# Patient Record
Sex: Male | Born: 1990 | Race: Black or African American | Hispanic: No | Marital: Single | Smoking: Current every day smoker
Health system: Southern US, Community
[De-identification: ages and names within clinical notes are randomized; demographics above are authoritative.]

## PROBLEM LIST (undated history)

## (undated) DIAGNOSIS — H409 Unspecified glaucoma: Secondary | ICD-10-CM

## (undated) DIAGNOSIS — F319 Bipolar disorder, unspecified: Secondary | ICD-10-CM

## (undated) DIAGNOSIS — B009 Herpesviral infection, unspecified: Secondary | ICD-10-CM

## (undated) HISTORY — PX: EYE SURGERY: SHX253

---
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
## 2012-10-04 ENCOUNTER — Emergency Department: Payer: Self-pay | Admitting: Emergency Medicine

## 2018-11-11 ENCOUNTER — Ambulatory Visit
Admission: EM | Admit: 2018-11-11 | Discharge: 2018-11-11 | Disposition: A | Discharge status: Home / Self Care | Payer: Self-pay | Attending: Family Medicine | Admitting: Family Medicine

## 2018-11-11 ENCOUNTER — Encounter: Payer: Self-pay | Admitting: Emergency Medicine

## 2018-11-11 DIAGNOSIS — Z8669 Personal history of other diseases of the nervous system and sense organs: Secondary | ICD-10-CM

## 2018-11-11 DIAGNOSIS — H539 Unspecified visual disturbance: Secondary | ICD-10-CM

## 2018-11-11 DIAGNOSIS — H5711 Ocular pain, right eye: Secondary | ICD-10-CM

## 2018-11-11 HISTORY — DX: Unspecified glaucoma: H40.9

## 2018-11-11 NOTE — ED Provider Notes (Signed)
[facility]    CSN: [id number] Arrival date & time: 11/11/18  0809     History   Chief Complaint Chief Complaint  Patient presents with  . Eye Pain    HPI Gary Jacobson is a 28 y.o. male.   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 Denies any injuries. States he's had surgery on that eye in the past when he was a young child.      Past Medical History:  Diagnosis Date  . Glaucoma     There are no active problems to display for this patient.   Past Surgical History:  Procedure Laterality Date  . EYE SURGERY     pt states when he was infant       Home Medications    Prior to Admission medications   Medication Sig Start Date End Date Taking? Authorizing Provider  aspirin-sod bicarb-citric acid (ALKA-SELTZER) 325 MG TBEF Take 325 mg by mouth every 6 (six) hours as needed. cold     [provider]  guaiFENesin (MUCINEX) 600 MG 12 hr tablet Take 600 mg by mouth 2 (two) times daily as needed. congestion     [provider]    Family History Family History  Problem Relation Age of Onset  . Hypertension Mother     Social History Social History   Tobacco Use  . Smoking status: Current Every Day Smoker    Packs/day: 1.00    Types: Cigarettes  . Smokeless tobacco: Never Used  Substance Use Topics  . Alcohol use: Yes  . Drug use: Not Currently    Types: Marijuana     Allergies   Patient has no known allergies.   Review of Systems Review of Systems   Physical Exam Triage Vital Signs ED Triage Vitals  Enc Vitals Group     BP 11/11/18 0826 (!) 129/92     Pulse Rate 11/11/18 0826 (!) 59     Resp 11/11/18 0826 16     Temp 11/11/18 0833 98 F (36.7 C)     Temp src --      SpO2 11/11/18 0826 99 %     Weight --      Height --      Head Circumference --      Peak Flow --      Pain Score --    Pain Loc --      Pain Edu? --      Excl. in [place]? --    No data found.  Updated Vital Signs BP (!) 129/92 (BP Location: Left Arm)   Pulse (!) 59   Temp 98 F (36.7 C)   Resp 16   SpO2 99%   Visual Acuity Right Eye Distance: 20/200 Left Eye Distance: 20/40 Bilateral Distance:    Right Eye Near:   Left Eye Near:    Bilateral Near:     Physical Exam Vitals signs reviewed.  Constitutional:      General: He is not in acute distress.    Appearance: He is not toxic-appearing.  Eyes:     General: Lids are normal.     Extraocular Movements: Extraocular movements intact.     Conjunctiva/sclera:     Right eye: Right conjunctiva is not injected.     Left eye: Left conjunctiva is not injected.     Pupils: Pupils are unequal.  Comments: Right eye pupil dilated  Neurological:     Mental Status: He is alert.      UC Treatments / Results  Labs (all labs ordered are listed, but only abnormal results are displayed) Labs Reviewed - No data to display  EKG   Radiology No results found.  Procedures Procedures (including critical care time)  Medications Ordered in UC Medications - No data to display  Initial Impression / Assessment and Plan / UC Course  I have reviewed the triage vital signs and the nursing notes.  Pertinent labs & imaging results that were available during my care of the patient were reviewed by me and considered in my medical decision making (see chart for details).      Final Clinical Impressions(s) / UC Diagnoses   Final diagnoses:  Acute right eye pain  Vision disturbance  H/O: glaucoma     Discharge Instructions     Follow up at [facility] in [place] this morning with Dr. Lara MulchHarrow    ED Prescriptions    None      1. diagnosis reviewed with patient; discussed case with Dr. Lara MulchHarrow (on call for [facility]) who states patient can be seen this morning at the [facility] [place] office. Instructions given  to patient who will proceed there at this time.    Controlled Substance Prescriptions  Controlled Substance Registry consulted? Not Applicable   Payton Mccallumonty, Jasemine Nawaz, MD 11/11/18 1143

## 2018-11-11 NOTE — ED Triage Notes (Signed)
Patient states he has had multiple surgeries on his right eye and has been having increasing sensitivity to light and pain in the right eye

## 2018-11-11 NOTE — Discharge Instructions (Addendum)
Follow up at [facility] in [place] this morning with Dr. Neville Route

## 2019-02-02 ENCOUNTER — Encounter: Payer: Self-pay | Admitting: Emergency Medicine

## 2019-02-02 ENCOUNTER — Other Ambulatory Visit: Payer: Self-pay

## 2019-02-02 ENCOUNTER — Ambulatory Visit
Admission: EM | Admit: 2019-02-02 | Discharge: 2019-02-02 | Disposition: A | Discharge status: Home / Self Care | Payer: Self-pay | Attending: Family | Admitting: Family

## 2019-02-02 DIAGNOSIS — R030 Elevated blood-pressure reading, without diagnosis of hypertension: Secondary | ICD-10-CM

## 2019-02-02 DIAGNOSIS — R0789 Other chest pain: Secondary | ICD-10-CM

## 2019-02-02 DIAGNOSIS — S29011A Strain of muscle and tendon of front wall of thorax, initial encounter: Secondary | ICD-10-CM

## 2019-02-02 HISTORY — DX: Bipolar disorder, unspecified: F31.9

## 2019-02-02 MED ORDER — NAPROXEN 500 MG PO TABS: 500.0000 mg | ORAL_TABLET | Freq: Two times a day (BID) | ORAL | 0 refills | Status: DC | PRN

## 2019-02-02 MED ORDER — CYCLOBENZAPRINE HCL 10 MG PO TABS: ORAL_TABLET | ORAL | 0 refills | Status: DC

## 2019-02-02 NOTE — ED Triage Notes (Signed)
Patient here today stated that 3d ago at work opened door felt a pain not work related per patient. But before then was playing with girlfriend and picked her up and felt something.  TDH:RCBU seltzer pain relief, Hydrocodone and antacid

## 2019-02-02 NOTE — Discharge Instructions (Addendum)
Recommend start Naproxen 500mg  twice a day as needed for pain. May use Flexeril muscle relaxer- take 1 whole tablet at night- may take 1/2 to 1 whole tablet every 8 to 12 hours as needed during the day- may cause drowsiness. May apply warm moist heat to area for comfort. Avoid lifting or straining muscles in chest. If any increase in pain, difficulty breathing, nausea or numbness occurs, go to the ER ASAP. Otherwise, Follow-up with your PCP in 4 to 5 days for recheck.

## 2019-02-03 NOTE — ED Provider Notes (Signed)
[facility]    CSN: [id number] Arrival date & time: 02/02/19  1019      History   Chief Complaint Chief Complaint  Patient presents with  . Chest Pain    HPI Gary Jacobson is a 28 y.o. male.   28 year old male presents with left sided mid-rib chest pain that has been present for the past 2 to 3 days. Originally was playing around with his girlfriend and picked her up off the ground about 4 to 5 days ago and felt a pull on the muscles of his chest. Minimal pain at the time. Then he used his left arm to pull at a door at work 3 days ago and felt immediate pain in his chest. He indicated that the pain is worse with certain movements and with taking a deep breath. He denies any vision changes, shortness of breath, sweating, nausea, vomiting or any numbness or radiation of pain down his arm. He took an Camera operator and antacid with minimal help and took an Oxycodone he had left over from previous injury which did help. No history of cardiac or pulmonary disease but does smoke tobacco daily. Mom has history of HTN and he has had elevated BP readings in the past. Otherwise, no other chronic health issues except bipolar disorder. Takes a multivitamin daily.   The history is provided by the patient.    Past Medical History:  Diagnosis Date  . Bipolar 1 disorder ([facility])   . Glaucoma     There are no active problems to display for this patient.   Past Surgical History:  Procedure Laterality Date  . EYE SURGERY     pt states when he was infant       Home Medications    Prior to Admission medications   Medication Sig Start Date End Date Taking? Authorizing Provider  cyclobenzaprine (FLEXERIL) 10 MG tablet Take 1/2 to 1 whole tablet by mouth every 8 hours as needed for muscle pain/spasms. 02/02/19   Katy Apo, NP  naproxen (NAPROSYN) 500 MG tablet Take 1 tablet (500 mg total) by mouth 2 (two) times daily as needed for moderate pain. 02/02/19   Katy Apo,  NP    Family History Family History  Problem Relation Age of Onset  . Hypertension Mother     Social History Social History   Tobacco Use  . Smoking status: Current Every Day Smoker    Packs/day: 1.00    Types: Cigarettes  . Smokeless tobacco: Never Used  Substance Use Topics  . Alcohol use: Yes  . Drug use: Not Currently    Types: Marijuana     Allergies   Patient has no known allergies.   Review of Systems Review of Systems  Constitutional: Negative for activity change, appetite change, chills, diaphoresis, fatigue and fever.  Eyes: Negative for photophobia and visual disturbance.  Respiratory: Positive for chest tightness (with deep breaths). Negative for cough, shortness of breath and wheezing.   Cardiovascular: Positive for chest pain. Negative for palpitations.  Gastrointestinal: Negative for abdominal pain, nausea and vomiting.  Musculoskeletal: Positive for myalgias. Negative for arthralgias, back pain and neck pain.  Skin: Negative for color change, rash and wound.  Allergic/Immunologic: Negative for environmental allergies, food allergies and immunocompromised state.  Neurological: Negative for dizziness, tremors, seizures, syncope, facial asymmetry, speech difficulty, weakness, light-headedness, numbness and headaches.  Hematological: Negative for adenopathy. Does not bruise/bleed easily.     Physical Exam Triage Vital Signs ED  Triage Vitals  Enc Vitals Group     BP 02/02/19 1044 (!) 143/100     Pulse Rate 02/02/19 1044 63     Resp 02/02/19 1044 18     Temp 02/02/19 1044 98.4 F (36.9 C)     Temp Source 02/02/19 1044 Oral     SpO2 02/02/19 1044 100 %     Weight 02/02/19 1037 175 lb (79.4 kg)     Height 02/02/19 1037 5\' 9"  (1.753 m)     Head Circumference --      Peak Flow --      Pain Score 02/02/19 1036 6     Pain Loc --      Pain Edu? --      Excl. in [place]? --    No data found.  Updated Vital Signs BP (!) 143/100   Pulse 63   Temp 98.4 F  (36.9 C) (Oral)   Resp 18   Ht 5\' 9"  (1.753 m)   Wt 175 lb (79.4 kg)   SpO2 100%   BMI 25.84 kg/m   Visual Acuity Right Eye Distance:   Left Eye Distance:   Bilateral Distance:    Right Eye Near:   Left Eye Near:    Bilateral Near:     Physical Exam Vitals signs and nursing note reviewed.  Constitutional:      General: He is awake. He is not in acute distress.    Appearance: He is well-developed, well-groomed and normal weight. He is not ill-appearing.     Comments: Patient sitting comfortably in exam bed in no acute distress.   HENT:     Head: Normocephalic and atraumatic.     Right Ear: Hearing and external ear normal.     Left Ear: Hearing and external ear normal.     Nose: Nose normal.     Mouth/Throat:     Lips: Pink.     Mouth: Mucous membranes are moist.     Pharynx: Oropharynx is clear.  Eyes:     Extraocular Movements: Extraocular movements intact.     Conjunctiva/sclera: Conjunctivae normal.     Pupils: Pupils are equal, round, and reactive to light.  Neck:     Musculoskeletal: Normal range of motion and neck supple.  Cardiovascular:     Rate and Rhythm: Normal rate and regular rhythm.     Pulses: Normal pulses.     Heart sounds: Normal heart sounds. No murmur.  Pulmonary:     Effort: Pulmonary effort is normal. No respiratory distress.     Breath sounds: Normal breath sounds and air entry. No decreased air movement. No decreased breath sounds, wheezing, rhonchi or rales.  Chest:     Chest wall: Tenderness present. No mass, deformity, swelling, crepitus or edema. There is no dullness to percussion.       Comments: Tender along rib cage under left breast. No distinct swelling. Has full range of motion of chest and arm but some pain and pulling of muscle with rotation of his left arm toward his back and lifting his arm above his head. No bruising seen.  Musculoskeletal: Normal range of motion.        General: Tenderness present.  Lymphadenopathy:      Upper Body:     Right upper body: No supraclavicular adenopathy.     Left upper body: No supraclavicular adenopathy.  Skin:    General: Skin is warm and dry.     Capillary Refill: Capillary refill takes less  than 2 seconds.     Findings: No bruising, erythema or rash.  Neurological:     General: No focal deficit present.     Mental Status: He is alert and oriented to person, place, and time.     Cranial Nerves: No cranial nerve deficit or facial asymmetry.     Sensory: Sensation is intact.     Motor: Motor function is intact.  Psychiatric:        Mood and Affect: Mood normal.        Behavior: Behavior normal. Behavior is cooperative.        Thought Content: Thought content normal.        Judgment: Judgment normal.      UC Treatments / Results  Labs (all labs ordered are listed, but only abnormal results are displayed) Labs Reviewed - No data to display  EKG   Radiology No results found.  Procedures ED EKG  Date/Time: 02/02/2019 10:54 AM Performed by: Sudie Grumbling, NP Authorized by: Sudie Grumbling, NP   ECG reviewed by ED Physician in the absence of a cardiologist: no   Previous ECG:    Previous ECG:  Unavailable Interpretation:    Interpretation: non-specific   Rate:    ECG rate:  50   ECG rate assessment: bradycardic   Rhythm:    Rhythm: sinus bradycardia   Ectopy:    Ectopy: none   T waves:    T waves: non-specific   Comments:     Reviewed EKG results with Colon Flattery, PA- did not appear to have significant conduction delay and believes the elevated T waves that trigger the result of pericarditis may be a result of the chest wall muscle strain and tenderness. Do not see findings suggestive of an emergent condition. Would recommend repeating EKG and/or following up with his PCP within a week.    (including critical care time)  Medications Ordered in UC Medications - No data to display  Initial Impression / Assessment and Plan / UC Course  I have  reviewed the triage vital signs and the nursing notes.  Pertinent labs & imaging results that were available during my care of the patient were reviewed by me and considered in my medical decision making (see chart for details).    Reviewed EKG with Colon Flattery, PA. Does have some bradycardia but patient does exercise often. Doubt cardiac origin or pericarditis- more likely chest wall muscle strain. Will continue to monitor. Recommend trial Naproxen 500mg  twice a day as directed for pain. May use Flexeril 10mg  1/2 to 1 tablet every 8 hours as needed for muscle pain/spasms. Apply warm compresses to area for comfort. Avoid lifting or further straining muscles in chest. Note written for work. Continue to monitor blood pressure. IF any increase in pain, difficulty breathing, dizziness, nausea or numbness occurs, go to the ER ASAP. Otherwise, follow up with your PCP in 4 to 5 days for blood pressure and symptom recheck.   Final Clinical Impressions(s) / UC Diagnoses   Final diagnoses:  Chest wall pain  Muscle strain of chest wall, initial encounter  Elevated blood-pressure reading without diagnosis of hypertension     Discharge Instructions     Recommend start Naproxen 500mg  twice a day as needed for pain. May use Flexeril muscle relaxer- take 1 whole tablet at night- may take 1/2 to 1 whole tablet every 8 to 12 hours as needed during the day- may cause drowsiness. May apply warm moist heat to area for  comfort. Avoid lifting or straining muscles in chest. If any increase in pain, difficulty breathing, nausea or numbness occurs, go to the ER ASAP. Otherwise, Follow-up with your PCP in 4 to 5 days for recheck.     ED Prescriptions    Medication Sig Dispense Auth. Provider   naproxen (NAPROSYN) 500 MG tablet Take 1 tablet (500 mg total) by mouth 2 (two) times daily as needed for moderate pain. 20 tablet Sudie Grumbling, NP   cyclobenzaprine (FLEXERIL) 10 MG tablet Take 1/2 to 1 whole tablet by mouth  every 8 hours as needed for muscle pain/spasms. 12 tablet Danuta Huseman, Ali Lowe, NP     PDMP not reviewed this encounter.   Sudie Grumbling, NP 02/03/19 1036

## 2019-05-07 ENCOUNTER — Other Ambulatory Visit: Payer: Self-pay

## 2019-05-07 ENCOUNTER — Ambulatory Visit
Admission: EM | Admit: 2019-05-07 | Discharge: 2019-05-07 | Disposition: A | Discharge status: Home / Self Care | Payer: Self-pay | Attending: Emergency Medicine | Admitting: Emergency Medicine

## 2019-05-07 DIAGNOSIS — R21 Rash and other nonspecific skin eruption: Secondary | ICD-10-CM

## 2019-05-07 MED ORDER — PREDNISONE 10 MG (21) PO TBPK: ORAL_TABLET | ORAL | 0 refills | Status: DC

## 2019-05-07 MED ORDER — PERMETHRIN 5 % EX CREA: TOPICAL_CREAM | CUTANEOUS | 0 refills | Status: DC

## 2019-05-07 NOTE — Discharge Instructions (Addendum)
Switch back to your old laundry detergent, finish the prednisone taper.  Try Claritin or Zyrtec.  If this does not work, then use the permethrin.  This is for scabies.  I think that this is less likely, I think this is more a contact dermatitis or from fleas.  Make sure you wash all of your sheets, linens and towels in very hot water.

## 2019-05-07 NOTE — ED Triage Notes (Signed)
Patient presents to Urgent Care with complaints of irritating rash in bilateral axilla since last week. Patient reports he may have used an expired deodorant, states there is one new place on his face as well.

## 2019-05-07 NOTE — ED Provider Notes (Signed)
HPI  SUBJECTIVE:  Gary Jacobson is a 29 y.o. male who presents with 2 weeks of a fine, occasionally itchy, rash described as "bumps" concentrated in his bilateral axillae, but also located on his torso, back.  No fevers, body aches, crusting.  No itching worse at night.  Denies pain, burning.  He is using a new Engineer, manufacturing.  No new lotions, soaps, foods.  He does not take any medications on a regular basis.  No contacts with similar rash.  No sensation of being bitten at night, blood on the bed clothes in the morning, known exposure to poison ivy or poison oak.  He states that he had a dog in the house is not sure if it had fleas or not.  He tried an unknown ointment for ringworm and an unknown antiitch cream without improvement in his symptoms.  Symptoms are worse with taking hot showers.  No alleviating factors.  He has a past medical history of HSV.  No history of diabetes, eczema.  PMD: None.    Past Medical History:  Diagnosis Date  . Bipolar 1 disorder ([facility])   . Glaucoma     Past Surgical History:  Procedure Laterality Date  . EYE SURGERY     pt states when he was infant    Family History  Problem Relation Age of Onset  . Hypertension Mother   . Asthma Mother   . Healthy Father     Social History   Tobacco Use  . Smoking status: Current Every Day Smoker    Packs/day: 0.30    Types: Cigarettes  . Smokeless tobacco: Never Used  Substance Use Topics  . Alcohol use: Yes    Comment: rare  . Drug use: Not Currently    Types: Marijuana    Comment: daily    No current facility-administered medications for this encounter.  Current Outpatient Medications:  .  permethrin (ELIMITE) 5 % cream, Apply from chin down, leave on for 8-14 hours, rinse. Repeat in 1 week, Disp: 60 g, Rfl: 0 .  predniSONE (STERAPRED UNI-PAK 21 TAB) 10 MG (21) TBPK tablet, Dispense one 6 day pack. Take as directed with food., Disp: 21 tablet, Rfl: 0  No Known Allergies   ROS  As  noted in HPI.   Physical Exam  BP 132/82 (BP Location: Left Arm)   Pulse 72   Temp 98.6 F (37 C) (Oral)   Resp 16   SpO2 99%   Constitutional: Well developed, well nourished, no acute distress Eyes:  EOMI, conjunctiva normal bilaterally HENT: Normocephalic, atraumatic,mucus membranes moist Respiratory: Normal inspiratory effort Cardiovascular: Normal rate GI: nondistended skin:  nontender papules without crusting or blisters in bilateral axilla, torso.  Seem to be concentrated in the axilla.  No burrows between fingers.        Musculoskeletal: no deformities Neurologic: Alert & oriented x 3, no focal neuro deficits Psychiatric: Speech and behavior appropriate   ED Course   Medications - No data to display  No orders of the defined types were placed in this encounter.   No results found for this or any previous visit (from the past 24 hour(s)). No results found.  ED Clinical Impression  1. Rash      ED Assessment/Plan  Patient with rash.  Could be fleas versus scabies versus contact dermatitis from the new bleach that he is using. will have him switch to his old laundry detergent, sent home with a prednisone 6-day taper, Claritin or Zyrtec.  If this does not work, permethrin.  Providing primary care list for ongoing care.   Meds ordered this encounter  Medications  . permethrin (ELIMITE) 5 % cream    Sig: Apply from chin down, leave on for 8-14 hours, rinse. Repeat in 1 week    Dispense:  60 g    Refill:  0  . predniSONE (STERAPRED UNI-PAK 21 TAB) 10 MG (21) TBPK tablet    Sig: Dispense one 6 day pack. Take as directed with food.    Dispense:  21 tablet    Refill:  0    *This clinic note was created using [employer]. Therefore, there may be occasional mistakes despite careful proofreading.   ?    Melynda Ripple, MD 05/08/19 [phone]

## 2019-05-19 ENCOUNTER — Ambulatory Visit
Admission: EM | Admit: 2019-05-19 | Discharge: 2019-05-19 | Disposition: A | Discharge status: Home / Self Care | Payer: Self-pay | Attending: Emergency Medicine | Admitting: Emergency Medicine

## 2019-05-19 ENCOUNTER — Other Ambulatory Visit: Payer: Self-pay

## 2019-05-19 ENCOUNTER — Encounter: Payer: Self-pay | Admitting: Emergency Medicine

## 2019-05-19 DIAGNOSIS — R21 Rash and other nonspecific skin eruption: Secondary | ICD-10-CM

## 2019-05-19 DIAGNOSIS — L42 Pityriasis rosea: Secondary | ICD-10-CM

## 2019-05-19 MED ORDER — HYDROXYZINE HCL 25 MG PO TABS: 25.0000 mg | ORAL_TABLET | Freq: Three times a day (TID) | ORAL | 0 refills | Status: AC | PRN

## 2019-05-19 MED ORDER — PREDNISONE 10 MG PO TABS: ORAL_TABLET | ORAL | 0 refills | Status: DC

## 2019-05-19 NOTE — ED Provider Notes (Signed)
[facility] ____________________________________________  Time seen: Approximately 9:50 AM  I have reviewed the triage vital signs and the nursing notes.   HISTORY  Chief Complaint Rash   HPI Gary Jacobson is a 29 y.o. male presenting for reevaluation of itchy rash that has been present for approximately 3 weeks.  Patient reports he was seen at the beginning of January for the same complaint and states that the medicine may have helped some, the cream did not help, but reports the rash has persisted.  States it changes locations.  States he did have a few larger areas on his back at initial onset.  Denies any pain.  Denies others around him or in the household with similar.  States rash is very itchy.  Has been under stress lately.  No rash to palms of hands or soles of feet.  States the rash is mostly in his torso and less in his lower extremities.  States initially he had concerned it was due to a dog or detergent, states he has changed to hypoallergenic as well as gotten rid of the dog and no change.  Denies other aggravating alleviating factors.  No recent fevers or sickness.  States feels well otherwise.   Past Medical History:  Diagnosis Date  . Bipolar 1 disorder (HCC)   . Glaucoma     There are no problems to display for this patient.   Past Surgical History:  Procedure Laterality Date  . EYE SURGERY     pt states when he was infant     No current facility-administered medications for this encounter.  Current Outpatient Medications:  .  hydrOXYzine (ATARAX/VISTARIL) 25 MG tablet, Take 1 tablet (25 mg total) by mouth 3 (three) times daily as needed for itching., Disp: 20 tablet, Rfl: 0 .  predniSONE (DELTASONE) 10 MG tablet, Start 60 mg po day one, then 50 mg po day two, taper by 10 mg daily until complete., Disp: 21 tablet, Rfl: 0  Allergies Patient has no known allergies.  Family History  Problem Relation Age of Onset  . Hypertension Mother   .  Asthma Mother   . Healthy Father     Social History Social History   Tobacco Use  . Smoking status: Current Every Day Smoker    Packs/day: 0.30    Types: Cigarettes  . Smokeless tobacco: Never Used  Substance Use Topics  . Alcohol use: Yes    Comment: rare  . Drug use: Not Currently    Types: Marijuana    Comment: daily    Review of Systems Constitutional: No fever ENT: No sore throat. Cardiovascular: Denies chest pain. Respiratory: Denies shortness of breath. Gastrointestinal: No abdominal pain.  No nausea, no vomiting.  No diarrhea. Musculoskeletal: Negative for back pain. Skin: positive for rash. Neurological: Negative for focal weakness or numbness.   ____________________________________________   PHYSICAL EXAM:  VITAL SIGNS: ED Triage Vitals  Enc Vitals Group     BP 05/19/19 0905 131/75     Pulse Rate 05/19/19 0905 65     Resp 05/19/19 0905 18     Temp 05/19/19 0905 98.2 F (36.8 C)     Temp Source 05/19/19 0905 Oral     SpO2 05/19/19 0905 99 %     Weight 05/19/19 0858 160 lb (72.6 kg)     Height 05/19/19 0858 5\' 9"  (1.753 m)     Head Circumference --      Peak Flow --      Pain  Score 05/19/19 0858 0     Pain Loc --      Pain Edu? --      Excl. in [place]? --     Constitutional: Alert and oriented. Well appearing and in no acute distress. Eyes: Conjunctivae are normal.  ENT      Head: Normocephalic and atraumatic. Cardiovascular: Normal heart rate. Good peripheral circulation. Respiratory: Normal respiratory effort without tachypnea nor retractions. Musculoskeletal:  Steady gait.  Neurologic:  Normal speech and language.Speech is normal. No gait instability.  Skin:  Skin is warm, dry.  Except: Scattered throughout torso and some to neck as well as upper extremities and proximal thighs flat, circular to ovular dry scaly rash, pruritic, nontender, no surrounding erythema. Psychiatric: Mood and affect are normal. Speech and behavior are normal. Patient  exhibits appropriate insight and judgment   ___________________________________________   LABS (all labs ordered are listed, but only abnormal results are displayed)  Labs Reviewed - No data to display ________________________________________   PROCEDURES Procedures    INITIAL IMPRESSION / ASSESSMENT AND PLAN / ED COURSE  Pertinent labs & imaging results that were available during my care of the patient were reviewed by me and considered in my medical decision making (see chart for details).  Well-appearing patient.  No acute distress.  Rash clinical appearance consistent with pityriasis rosea.  Patient did note some improvement with prednisone due to any concern of allergic reaction, will represcribe.  However discussed supportive care pityriasis.  As needed hydroxyzine.  Follow-up with dermatology for continued complaints. Discussed indication, risks and benefits of medications with patient.  Discussed follow up and return parameters including no resolution or any worsening concerns. Patient verbalized understanding and agreed to plan.   ____________________________________________   FINAL CLINICAL IMPRESSION(S) / ED DIAGNOSES  Final diagnoses:  Rash  Pityriasis rosea     ED Discharge Orders         Ordered    predniSONE (DELTASONE) 10 MG tablet     05/19/19 0930    hydrOXYzine (ATARAX/VISTARIL) 25 MG tablet  3 times daily PRN     05/19/19 0930           Note: This dictation was prepared with Dragon dictation along with smaller phrase technology. Any transcriptional errors that result from this process are unintentional.         Marylene Land, NP 05/19/19 1000

## 2019-05-19 NOTE — Discharge Instructions (Signed)
Take medication as prescribed. Rest. Drink plenty of fluids. Monitor.  ° °Follow up with your primary care physician this week as needed. Return to Urgent care for new or worsening concerns.  ° °

## 2019-05-19 NOTE — ED Triage Notes (Signed)
Pt c/o rash on bilateral forearms, neck, torso, and thighs. He states that he noticed it shortly after christmas. He was seen on 05/07/19 and given a cream and prednisone. He states that it is better in some places but has spread to different places on his body. He states that the rash is itchy, flat and dry.

## 2019-07-15 ENCOUNTER — Emergency Department
Admission: EM | Admit: 2019-07-15 | Discharge: 2019-07-16 | Disposition: A | Discharge status: Other Acute Inpatient Hospital | Payer: Medicaid Other | Attending: Emergency Medicine | Admitting: Emergency Medicine

## 2019-07-15 ENCOUNTER — Other Ambulatory Visit: Payer: Self-pay

## 2019-07-15 ENCOUNTER — Encounter: Payer: Self-pay | Admitting: Emergency Medicine

## 2019-07-15 DIAGNOSIS — Z79899 Other long term (current) drug therapy: Secondary | ICD-10-CM | POA: Insufficient documentation

## 2019-07-15 DIAGNOSIS — Z046 Encounter for general psychiatric examination, requested by authority: Secondary | ICD-10-CM | POA: Insufficient documentation

## 2019-07-15 DIAGNOSIS — Z20822 Contact with and (suspected) exposure to covid-19: Secondary | ICD-10-CM | POA: Insufficient documentation

## 2019-07-15 DIAGNOSIS — F419 Anxiety disorder, unspecified: Secondary | ICD-10-CM | POA: Insufficient documentation

## 2019-07-15 DIAGNOSIS — F17210 Nicotine dependence, cigarettes, uncomplicated: Secondary | ICD-10-CM | POA: Insufficient documentation

## 2019-07-15 DIAGNOSIS — F322 Major depressive disorder, single episode, severe without psychotic features: Secondary | ICD-10-CM | POA: Insufficient documentation

## 2019-07-15 DIAGNOSIS — R45851 Suicidal ideations: Secondary | ICD-10-CM | POA: Insufficient documentation

## 2019-07-15 LAB — RESPIRATORY PANEL BY RT PCR (FLU A&B, COVID)
Influenza A by PCR: NEGATIVE
Influenza B by PCR: NEGATIVE
SARS Coronavirus 2 by RT PCR: NEGATIVE

## 2019-07-15 LAB — COMPREHENSIVE METABOLIC PANEL
ALT: 37 U/L (ref 0–44)
AST: 28 U/L (ref 15–41)
Albumin: 5.2 g/dL — ABNORMAL HIGH (ref 3.5–5.0)
Alkaline Phosphatase: 62 U/L (ref 38–126)
Anion gap: 10 (ref 5–15)
BUN: 9 mg/dL (ref 6–20)
CO2: 26 mmol/L (ref 22–32)
Calcium: 10.3 mg/dL (ref 8.9–10.3)
Chloride: 101 mmol/L (ref 98–111)
Creatinine, Ser: 0.96 mg/dL (ref 0.61–1.24)
GFR calc Af Amer: >60 mL/min (ref >60)
GFR calc non Af Amer: >60 mL/min (ref >60)
Glucose, Bld: 111 mg/dL — ABNORMAL HIGH (ref 70–99)
Potassium: 4.2 mmol/L (ref 3.5–5.1)
Sodium: 137 mmol/L (ref 135–145)
Total Bilirubin: 0.5 mg/dL (ref 0.3–1.2)
Total Protein: 8.5 g/dL — ABNORMAL HIGH (ref 6.5–8.1)

## 2019-07-15 LAB — CBC
HCT: 46.5 % (ref 39.0–52.0)
Hemoglobin: 15.6 g/dL (ref 13.0–17.0)
MCH: 30.6 pg (ref 26.0–34.0)
MCHC: 33.5 g/dL (ref 30.0–36.0)
MCV: 91.2 fL (ref 80.0–100.0)
Platelets: 246 10*3/uL (ref 150–400)
RBC: 5.1 MIL/uL (ref 4.22–5.81)
RDW: 12.8 % (ref 11.5–15.5)
WBC: 7.6 10*3/uL (ref 4.0–10.5)
nRBC: 0 % (ref 0.0–0.2)

## 2019-07-15 LAB — ETHANOL: Alcohol, Ethyl (B): <10 mg/dL (ref <10)

## 2019-07-15 LAB — SALICYLATE LEVEL: Salicylate Lvl: <7 mg/dL — ABNORMAL LOW (ref 7.0–30.0)

## 2019-07-15 LAB — ACETAMINOPHEN LEVEL: Acetaminophen (Tylenol), Serum: <10 ug/mL — ABNORMAL LOW (ref 10–30)

## 2019-07-15 MED ORDER — DIAZEPAM 5 MG PO TABS
10.0000 mg | ORAL_TABLET | Freq: Once | ORAL | Status: AC
Start: 2019-07-15 — End: 2019-07-15
  Administered 2019-07-15: 16:00:00 10 mg via ORAL
  Filled 2019-07-15: qty 2

## 2019-07-15 MED ORDER — HYDROXYZINE HCL 25 MG PO TABS
50.0000 mg | ORAL_TABLET | Freq: Once | ORAL | Status: AC
  Administered 2019-07-15: 50 mg via ORAL
  Filled 2019-07-15: qty 2

## 2019-07-15 NOTE — ED Notes (Signed)
IVC  CONSULT  DONE  PENDING  PLACEMENT 

## 2019-07-15 NOTE — ED Provider Notes (Signed)
[facility] Emergency Department Provider Note  ____________________________________________  Time seen: Approximately 2:30 PM  I have reviewed the triage vital signs and the nursing notes.   HISTORY  Chief Complaint Psychiatric Evaluation    HPI Gary Jacobson is a 29 y.o. male with a past history of bipolar disorder who comes the ED complaining of suicidal thoughts for the past few days, denies a specific plan but states that he is disinterested in whether he lives or dies, says that he is ready to die.  Says that on his way to work today he just felt overwhelmed and "gave up."  Reports poor sleep, poor appetite, tearfulness.  Was recently started on Prozac by [place], been compliant with it for the past 2 weeks but does not feel like it is helping.  Also currently having some legal troubles.  Symptoms are constant, severe, no aggravating or alleviating factors.    Past Medical History:  Diagnosis Date  . Bipolar 1 disorder ([place])   . Glaucoma      There are no problems to display for this patient.    Past Surgical History:  Procedure Laterality Date  . EYE SURGERY     pt states when he was infant     Prior to Admission medications   Medication Sig Start Date End Date Taking? Authorizing Provider  doxepin (SINEQUAN) 50 MG capsule Take 50 mg by mouth at bedtime. 06/26/19  Yes [provider]  FLUoxetine (PROZAC) 10 MG capsule Take 10 mg by mouth daily. 06/26/19  Yes [provider]  hydrOXYzine (ATARAX/VISTARIL) 25 MG tablet Take 1 tablet (25 mg total) by mouth 3 (three) times daily as needed for itching. 05/19/19  Yes Marylene Land, NP  risperiDONE (RISPERDAL) 1 MG tablet Take 1 mg by mouth at bedtime. 06/26/19  Yes [provider]     Allergies Patient has no known allergies.   Family History  Problem Relation Age of Onset  . Hypertension Mother   . Asthma Mother   . Healthy Father     Social  History Social History   Tobacco Use  . Smoking status: Current Every Day Smoker    Packs/day: 0.30    Types: Cigarettes  . Smokeless tobacco: Never Used  Substance Use Topics  . Alcohol use: Yes    Comment: rare  . Drug use: Not Currently    Types: Marijuana    Comment: daily    Review of Systems  Constitutional:   No fever or chills.  ENT:   No sore throat. No rhinorrhea. Cardiovascular:   No chest pain or syncope. Respiratory:   No dyspnea or cough. Gastrointestinal:   Negative for abdominal pain, vomiting and diarrhea.  Musculoskeletal:   Negative for focal pain or swelling All other systems reviewed and are negative except as documented above in ROS and HPI.  ____________________________________________   PHYSICAL EXAM:  VITAL SIGNS: ED Triage Vitals [07/15/19 1247]  Enc Vitals Group     BP (!) 171/86     Pulse Rate 66     Resp 18     Temp 99.1 F (37.3 C)     Temp Source Oral     SpO2 99 %     Weight 170 lb (77.1 kg)     Height 5\' 9"  (1.753 m)     Head Circumference      Peak Flow      Pain Score 0     Pain Loc  Pain Edu?      Excl. in [place]?     Vital signs reviewed, nursing assessments reviewed.   Constitutional:   Alert and oriented. Non-toxic appearance. Eyes:   Conjunctivae are normal. EOMI. PERRL. ENT      Head:   Normocephalic and atraumatic.      Nose:   Wearing a mask.      Mouth/Throat:   Wearing a mask.      Neck:   No meningismus. Full ROM. Hematological/Lymphatic/Immunilogical:   No cervical lymphadenopathy. Cardiovascular:   RRR. Symmetric bilateral radial and DP pulses.  No murmurs. Cap refill less than 2 seconds. Respiratory:   Normal respiratory effort without tachypnea/retractions. Breath sounds are clear and equal bilaterally. No wheezes/rales/rhonchi. Gastrointestinal:   Soft and nontender. Non distended. There is no CVA tenderness.  No rebound, rigidity, or guarding.  Musculoskeletal:   Normal range of motion in all  extremities. No joint effusions.  No lower extremity tenderness.  No edema.  No injuries Neurologic:   Normal speech and language.  Motor grossly intact. No acute focal neurologic deficits are appreciated.  Skin:    Skin is warm, dry and intact. No rash noted.  No petechiae, purpura, or bullae.  ____________________________________________    LABS (pertinent positives/negatives) (all labs ordered are listed, but only abnormal results are displayed) Labs Reviewed  COMPREHENSIVE METABOLIC PANEL - Abnormal; Notable for the following components:      Result Value   Glucose, Bld 111 (*)    Total Protein 8.5 (*)    Albumin 5.2 (*)    All other components within normal limits  SALICYLATE LEVEL - Abnormal; Notable for the following components:   Salicylate Lvl <7.0 (*)    All other components within normal limits  ACETAMINOPHEN LEVEL - Abnormal; Notable for the following components:   Acetaminophen (Tylenol), Serum <10 (*)    All other components within normal limits  ETHANOL  CBC  URINE DRUG SCREEN, QUALITATIVE ([facility] ONLY)   ____________________________________________   EKG    ____________________________________________    RADIOLOGY  No results found.  ____________________________________________   PROCEDURES Procedures  ____________________________________________    CLINICAL IMPRESSION / ASSESSMENT AND PLAN / ED COURSE  Medications ordered in the ED: Medications - No data to display  Pertinent labs & imaging results that were available during my care of the patient were reviewed by me and considered in my medical decision making (see chart for details).  Gary Jacobson was evaluated in Emergency Department on 07/15/2019 for the symptoms described in the history of present illness. He was evaluated in the context of the global COVID-19 pandemic, which necessitated consideration that the patient might be at risk for infection with the SARS-CoV-2 virus that  causes COVID-19. Institutional protocols and algorithms that pertain to the evaluation of patients at risk for COVID-19 are in a state of rapid change based on information released by regulatory bodies including the CDC and federal and state organizations. These policies and algorithms were followed during the patient's care in the ED.   Patient is nontoxic well-appearing, normal vital signs, presents with symptoms of depression.  No evidence of ingestion or self-harm so far.  Medically stable.  Discussed with psychiatry who plans to IVC the patient and admit for further stabilization of his bipolar/depression.      ____________________________________________   FINAL CLINICAL IMPRESSION(S) / ED DIAGNOSES    Final diagnoses:  Current severe episode of major depressive disorder without psychotic features, unspecified whether recurrent (HCC)  ED Discharge Orders    None      Portions of this note were generated with dragon dictation software. Dictation errors may occur despite best attempts at proofreading.   Sharman Cheek, MD 07/15/19 1432

## 2019-07-15 NOTE — ED Notes (Signed)
Hourly rounding reveals patient sleeping in room. No complaints, stable, in no acute distress. Q15 minute rounds and monitoring via Security Cameras to continue. 

## 2019-07-15 NOTE — ED Notes (Signed)
1 cell phone, 1 apple watch, a jacket, 1 t shirt, 1 pair running pants, 1 pair boxers, 1 pair socks, 1 pair tennis shoes, 1 wallet, 1 set of keys, 1 pair shiny yellow and gray earrings placed into a specimen cup and placed inside patient belongings bag.   Pt dressed into burgundy scrubs by this RN and [employer].

## 2019-07-15 NOTE — ED Notes (Signed)
Pt received sandwich tray and sprite for bedtime snack  Pt calm and resting at this time  Pt asked if he could remove shirt while in room because he was hot. Tech told pt to make sure to put shirt on when going to bathroom. PT agreed  lw edt

## 2019-07-15 NOTE — ED Notes (Signed)
Patient requesting meds for sleep.

## 2019-07-15 NOTE — BH Assessment (Signed)
Assessment Note  Gary Jacobson is an 29 y.o. male. with a reported past psychiatric history of bipolar disorder who is presents to the ED today with complaints of suicidal thoughts.  The patient reports that he has experienced suicidal ideations for about one year.Pt endorses experiencing auditory hallucinations for 2 or 3 years. These hallucinations  present in the form of paranoia as he often feels of as if others are talking about him. He states that at times the voices tell him to harm himself. Patient States that he has style about ways in which you would hurt itself which include overdosing on his anti psychotic medication or wrecking his car  He reports inability to obtain restful sleep. Prior to today he reports working but states that he is unaware if he will have a job wjen discharged. Pt explains that he currently lives alone but no longer feel safe by himself. He admits to occasional marijuana use in States that he drinks one beer per day this is been ongoing behaviour for several years. Patient denies any active homicidally  or visual hallucination. no other medical complaints or concerns noted. A behavioral health assessment has been completed including evaluation of the patient, collecting collateral history:, reviewing available medical/clinic records, evaluating his unique risk and protective factors, and discussing treatment recommendations.    Diagnosis: Bipolar Disorder   Past Medical History:  Past Medical History:  Diagnosis Date  . Bipolar 1 disorder (HCC)   . Glaucoma     Past Surgical History:  Procedure Laterality Date  . EYE SURGERY     pt states when he was infant    Family History:  Family History  Problem Relation Age of Onset  . Hypertension Mother   . Asthma Mother   . Healthy Father     Social History:  reports that he has been smoking cigarettes. He has been smoking about 0.30 packs per day. He has never used smokeless tobacco. He reports current  alcohol use. He reports previous drug use. Drug: Marijuana.  Additional Social History:  Alcohol / Drug Use Pain Medications: SEE PTA Prescriptions: SEE PTA Over the Counter: SEE PTA History of alcohol / drug use?: Yes Substance #1 Name of Substance 1: Alcohol 1 - Age of First Use: 17 1 - Amount (size/oz): 1 beer 1 - Frequency: daily 1 - Duration: ongoing 1 - Last Use / Amount: yesterday Substance #2 Name of Substance 2: THC 2 - Age of First Use: 17 2 - Amount (size/oz): Unknown 2 - Frequency: 2/3 week 2 - Duration: ongoing 2 - Last Use / Amount: yesterday  CIWA: CIWA-Ar BP: 134/76 Pulse Rate: 85 COWS:    Allergies: No Known Allergies  Home Medications: (Not in a hospital admission)   OB/GYN Status:  No LMP for male patient.  General Assessment Data Location of Assessment: [facility] ED TTS Assessment: In system Is this a Tele or Face-to-Face Assessment?: Tele Assessment Is this an Initial Assessment or a Re-assessment for this encounter?: Initial Assessment Patient Accompanied by:: N/A Language Other than English: No Living Arrangements: Other (Comment) What gender do you identify as?: Male Marital status: Single Living Arrangements: Other (Comment) Can pt return to current living arrangement?: Yes Admission Status: Involuntary Petitioner: ED Attending Is patient capable of signing voluntary admission?: No Referral Source: Other Insurance type: None   Medical Screening Exam [facility] Walk-in ONLY) Medical Exam completed: Yes  Crisis Care Plan Living Arrangements: Other (Comment) Name of Psychiatrist: [facility]  Name of Therapist: none  Education Status Is patient currently in school?: No Is the patient employed, unemployed or receiving disability?: Employed  Risk to self with the past 6 months Suicidal Ideation: Yes-Currently Present Has patient been a risk to self within the past 6 months prior to admission? : Yes Suicidal Intent: No Has patient  had any suicidal intent within the past 6 months prior to admission? : Yes Is patient at risk for suicide?: Yes Suicidal Plan?: No-Not Currently/Within Last 6 Months Has patient had any suicidal plan within the past 6 months prior to admission? : Yes Access to Means: Yes Specify Access to Suicidal Means: OD What has been your use of drugs/alcohol within the last 12 months?: Alcohol and THC Previous Attempts/Gestures: No How many times?: 0 Intentional Self Injurious Behavior: None Family Suicide History: Unknown Recent stressful life event(s): Conflict (Comment) Persecutory voices/beliefs?: Yes Depression: Yes Depression Symptoms: Insomnia, Loss of interest in usual pleasures, Feeling worthless/self pity, Feeling angry/irritable Substance abuse history and/or treatment for substance abuse?: No Suicide prevention information given to non-admitted patients: Not applicable  Risk to Others within the past 6 months Homicidal Ideation: No Does patient have any lifetime risk of violence toward others beyond the six months prior to admission? : No Thoughts of Harm to Others: No Current Homicidal Intent: No Current Homicidal Plan: No Access to Homicidal Means: No History of harm to others?: No Assessment of Violence: None Noted Violent Behavior Description: none Does patient have access to weapons?: No Criminal Charges Pending?: Yes Describe Pending Criminal Charges: assault Does patient have a court date: No Is patient on probation?: No  Psychosis Hallucinations: Auditory Delusions: Persecutory  Mental Status Report Appearance/Hygiene: In scrubs Eye Contact: Fair Motor Activity: Unremarkable Speech: Logical/coherent Level of Consciousness: Alert Mood: Depressed, Sad Affect: Anxious, Sad, Flat Anxiety Level: None Thought Processes: Coherent Judgement: Impaired Orientation: Person, Place, Time, Situation Obsessive Compulsive Thoughts/Behaviors: None  Cognitive  Functioning Concentration: Good Memory: Remote Intact, Recent Intact Is patient IDD: No Insight: Fair Impulse Control: Fair Appetite: Fair Have you had any weight changes? : No Change Sleep: Decreased Total Hours of Sleep: (4) Vegetative Symptoms: None  ADLScreening [facility] Assessment Services) Patient's cognitive ability adequate to safely complete daily activities?: Yes Patient able to express need for assistance with ADLs?: Yes Independently performs ADLs?: Yes (appropriate for developmental age)  Prior Inpatient Therapy Prior Inpatient Therapy: No  Prior Outpatient Therapy Prior Outpatient Therapy: Yes Prior Therapy Dates: Current Prior Therapy Facilty/Provider(s): [facility]  Reason for Treatment: bipoloar Does patient have an ACCT team?: No Does patient have Intensive In-House Services?  : No Does patient have [facility] services? : No Does patient have P4CC services?: No  ADL Screening (condition at time of admission) Patient's cognitive ability adequate to safely complete daily activities?: Yes Patient able to express need for assistance with ADLs?: Yes Independently performs ADLs?: Yes (appropriate for developmental age)       Abuse/Neglect Assessment (Assessment to be complete while patient is alone) Abuse/Neglect Assessment Can Be Completed: Yes Physical Abuse: Denies Verbal Abuse: Denies Sexual Abuse: Denies Exploitation of patient/patient's resources: Denies Self-Neglect: Denies Values / Beliefs Cultural Requests During Hospitalization: None Spiritual Requests During Hospitalization: None Consults Spiritual Care Consult Needed: No Transition of Care Team Consult Needed: No Advance Directives (For Healthcare) Does Patient Have a Medical Advance Directive?: No Would patient like information on creating a medical advance directive?: No - Patient declined          Disposition:  Disposition Initial Assessment Completed for this Encounter:  Yes Disposition of Patient: Admit  On Site Evaluation by:   Reviewed with Physician:    Laretta Alstrom 07/15/2019 10:49 PM

## 2019-07-15 NOTE — ED Notes (Signed)
IVC/Consult ordered/ Moved to BHU-5 

## 2019-07-15 NOTE — ED Triage Notes (Signed)
Pt presents to ED via POV with c/o suicidal thoughts. Pt denies plan at this time, pt states "No I don't have a plan I just don't care if I die, I'm ready to die". Pt states external stressors in the form of court cases next week, pt states "I'm having anxiety, I have meds but the meds don't allow me to work no job". Pt states he is stressed regarding the court cases because "she lied on the police reports saying I did stuff I didn't even do".

## 2019-07-15 NOTE — ED Notes (Signed)
Hourly rounding reveals patient awake in room. No complaints, stable, in no acute distress. Q15 minute rounds and monitoring via Security Cameras to continue. 

## 2019-07-15 NOTE — ED Notes (Signed)
Report to include Situation, Background, Assessment, and Recommendations received from Amy B. RN. Patient alert and oriented, warm and dry, in no acute distress. Patient denies SI, HI, AVH and pain. Patient made aware of Q15 minute rounds and security cameras for their safety. Patient instructed to come to me with needs or concerns. 

## 2019-07-15 NOTE — ED Notes (Signed)
Meal tray placed on chair.

## 2019-07-15 NOTE — Consult Note (Signed)
[facility] Face-to-Face Psychiatry Consult   Reason for Consult: Suicidal ideation Referring Physician: Dr. Scotty Court Patient Identification: Gary Jacobson MRN:  [id number] Principal Diagnosis: <principal problem not specified> Diagnosis:  Active Problems:   * No active hospital problems. *   Total Time spent with patient: 30 minutes  Subjective:   Gary Jacobson is a 29 y.o. male patient admitted with suicidal thoughts  HPI:    Patient is a 29 year old male with a reported past psychiatric history of bipolar disorder who is presents to the ED today with complaints of suicidal thoughts.  Patient states that he would do anything to get out of this world.  He states that he is trying to manage his issues on outpatient basis however felt extremely overwhelmed in the past several days.  Patient states that this overwhelming feeling has triggered his anxiety , made him unable to sleep and eventually has caused him to experience auditory hallucinations as well as severe suicidal ideation.  Patient states that he feels like giving up and is unable to manage his symptoms.  He reported this to his outpatient providers and they sent him to the emergency department.  Patient was recently started on Prozac approximately 2 weeks ago by his outpatient providers at [facility].  Prior to that he was treated at [facility] but was lost to follow-up.  Patient initially was requesting admission however during the course of the interview displayed significant anxiety, stood up in the room and started pacing stating "I cannot be here,  I cannot be here".  Patient was offered p.o. Valium to assist with anxiety and was agreeable for admission.   Patient endorses social stressors including recent upcoming court for domestic violence charges as well as some relationship and interpersonal difficulties.  Denies any significant medical issues    Past Psychiatric History: Patient reports past  psychiatric history notable for bipolar disorder.  Risk to Self:  Yes Risk to Others:  No Prior Inpatient Therapy:  No Prior Outpatient Therapy:  Yes  Past Medical History:  Past Medical History:  Diagnosis Date  . Bipolar 1 disorder (HCC)   . Glaucoma     Past Surgical History:  Procedure Laterality Date  . EYE SURGERY     pt states when he was infant   Family History:  Family History  Problem Relation Age of Onset  . Hypertension Mother   . Asthma Mother   . Healthy Father    Family Psychiatric  History: reports family history of mental lillness Social History:  Social History   Substance and Sexual Activity  Alcohol Use Yes   Comment: rare     Social History   Substance and Sexual Activity  Drug Use Not Currently  . Types: Marijuana   Comment: daily    Social History   Socioeconomic History  . Marital status: Single    Spouse name: Not on file  . Number of children: Not on file  . Years of education: Not on file  . Highest education level: Not on file  Occupational History  . Not on file  Tobacco Use  . Smoking status: Current Every Day Smoker    Packs/day: 0.30    Types: Cigarettes  . Smokeless tobacco: Never Used  Substance and Sexual Activity  . Alcohol use: Yes    Comment: rare  . Drug use: Not Currently    Types: Marijuana    Comment: daily  . Sexual activity: Not on file  Other Topics Concern  .  Not on file  Social History Narrative  . Not on file   Social Determinants of Health   Financial Resource Strain:   . Difficulty of Paying Living Expenses:   Food Insecurity:   . Worried About Charity fundraiser in the Last Year:   . Arboriculturist in the Last Year:   Transportation Needs:   . Film/video editor (Medical):   Marland Kitchen Lack of Transportation (Non-Medical):   Physical Activity:   . Days of Exercise per Week:   . Minutes of Exercise per Session:   Stress:   . Feeling of Stress :   Social Connections:   . Frequency of  Communication with Friends and Family:   . Frequency of Social Gatherings with Friends and Family:   . Attends Religious Services:   . Active Member of Clubs or Organizations:   . Attends Archivist Meetings:   Marland Kitchen Marital Status:    Additional Social History:    Allergies:  No Known Allergies  Labs:  Results for orders placed or performed during the hospital encounter of 07/15/19 (from the past 48 hour(s))  Comprehensive metabolic panel     Status: Abnormal   Collection Time: 07/15/19 12:51 PM  Result Value Ref Range   Sodium 137 135 - 145 mmol/L   Potassium 4.2 3.5 - 5.1 mmol/L   Chloride 101 98 - 111 mmol/L   CO2 26 22 - 32 mmol/L   Glucose, Bld 111 (H) 70 - 99 mg/dL    Comment: Glucose reference range applies only to samples taken after fasting for at least 8 hours.   BUN 9 6 - 20 mg/dL   Creatinine, Ser 0.96 0.61 - 1.24 mg/dL   Calcium 10.3 8.9 - 10.3 mg/dL   Total Protein 8.5 (H) 6.5 - 8.1 g/dL   Albumin 5.2 (H) 3.5 - 5.0 g/dL   AST 28 15 - 41 U/L   ALT 37 0 - 44 U/L   Alkaline Phosphatase 62 38 - 126 U/L   Total Bilirubin 0.5 0.3 - 1.2 mg/dL   GFR calc non Af Amer >60 >60 mL/min   GFR calc Af Amer >60 >60 mL/min   Anion gap 10 5 - 15    Comment: Performed at [facility], [address], [place], [place] [address]  Ethanol     Status: None   Collection Time: 07/15/19 12:51 PM  Result Value Ref Range   Alcohol, Ethyl (B) <10 <10 mg/dL    Comment: (NOTE) Lowest detectable limit for serum alcohol is 10 mg/dL. For medical purposes only. Performed at [facility], [address] [place], [place] [address]   Salicylate level     Status: Abnormal   Collection Time: 07/15/19 12:51 PM  Result Value Ref Range   Salicylate Lvl <5.6 (L) 7.0 - 30.0 mg/dL    Comment: Performed at [facility], [place], [place] [address]  Acetaminophen level     Status: Abnormal   Collection Time: 07/15/19 12:51 PM  Result Value Ref  Range   Acetaminophen (Tylenol), Serum <10 (L) 10 - 30 ug/mL    Comment: (NOTE) Therapeutic concentrations vary significantly. A range of 10-30 ug/mL  may be an effective concentration for many patients. However, some  are best treated at concentrations outside of this range. Acetaminophen concentrations >150 ug/mL at 4 hours after ingestion  and >50 ug/mL at 12 hours after ingestion are often associated with  toxic reactions. Performed at  [facility], [address] [place], [place] [address]   cbc     Status: None   Collection Time: 07/15/19 12:51 PM  Result Value Ref Range   WBC 7.6 4.0 - 10.5 K/uL   RBC 5.10 4.22 - 5.81 MIL/uL   Hemoglobin 15.6 13.0 - 17.0 g/dL   HCT 60.4 54.0 - 98.1 %   MCV 91.2 80.0 - 100.0 fL   MCH 30.6 26.0 - 34.0 pg   MCHC 33.5 30.0 - 36.0 g/dL   RDW 19.1 47.8 - 29.5 %   Platelets 246 150 - 400 K/uL   nRBC 0.0 0.0 - 0.2 %    Comment: Performed at [facility], [address], [place], [place] [address]  Respiratory Panel by RT PCR (Flu A&B, Covid) - Nasopharyngeal Swab     Status: None   Collection Time: 07/15/19  2:30 PM   Specimen: Nasopharyngeal Swab  Result Value Ref Range   SARS Coronavirus 2 by RT PCR NEGATIVE NEGATIVE    Comment: (NOTE) SARS-CoV-2 target nucleic acids are NOT DETECTED. The SARS-CoV-2 RNA is generally detectable in upper respiratoy specimens during the acute phase of infection. The lowest concentration of SARS-CoV-2 viral copies this assay can detect is 131 copies/mL. A negative result does not preclude SARS-Cov-2 infection and should not be used as the sole basis for treatment or other patient management decisions. A negative result may occur with  improper specimen collection/handling, submission of specimen other than nasopharyngeal swab, presence of viral mutation(s) within the areas targeted by this assay, and inadequate number of viral copies (<131 copies/mL). A negative result must be combined with  clinical observations, patient history, and epidemiological information. The expected result is Negative. Fact Sheet for Patients:  https://www.moore.com/ Fact Sheet for Healthcare Providers:  https://www.young.biz/ This test is not yet ap proved or cleared by the [place] FDA and  has been authorized for detection and/or diagnosis of SARS-CoV-2 by FDA under an Emergency Use Authorization (EUA). This EUA will remain  in effect (meaning this test can be used) for the duration of the COVID-19 declaration under Section 564(b)(1) of the Act, 21 U.S.C. section 360bbb-3(b)(1), unless the authorization is terminated or revoked sooner.    Influenza A by PCR NEGATIVE NEGATIVE   Influenza B by PCR NEGATIVE NEGATIVE    Comment: (NOTE) The Xpert Xpress SARS-CoV-2/FLU/RSV assay is intended as an aid in  the diagnosis of influenza from Nasopharyngeal swab specimens and  should not be used as a sole basis for treatment. Nasal washings and  aspirates are unacceptable for Xpert Xpress SARS-CoV-2/FLU/RSV  testing. Fact Sheet for Patients: https://www.moore.com/ Fact Sheet for Healthcare Providers: https://www.young.biz/ This test is not yet approved or cleared by the [place] FDA and  has been authorized for detection and/or diagnosis of SARS-CoV-2 by  FDA under an Emergency Use Authorization (EUA). This EUA will remain  in effect (meaning this test can be used) for the duration of the  Covid-19 declaration under Section 564(b)(1) of the Act, 21  U.S.C. section 360bbb-3(b)(1), unless the authorization is  terminated or revoked. Performed at [facility], [address] [place], [place] [address]     No current facility-administered medications for this encounter.   Current Outpatient Medications  Medication Sig Dispense Refill  . doxepin (SINEQUAN) 50 MG capsule Take 50 mg by mouth at bedtime.     Marland Kitchen FLUoxetine (PROZAC) 10 MG capsule Take 10 mg by mouth daily.    . hydrOXYzine (ATARAX/VISTARIL) 25  MG tablet Take 1 tablet (25 mg total) by mouth 3 (three) times daily as needed for itching. 20 tablet 0  . risperiDONE (RISPERDAL) 1 MG tablet Take 1 mg by mouth at bedtime.      Musculoskeletal: Strength & Muscle Tone: within normal limits Gait & Station: normal Patient leans: N/A  Psychiatric Specialty Exam: Physical Exam  Review of Systems  Psychiatric/Behavioral: Positive for hallucinations, sleep disturbance and suicidal ideas. Negative for agitation. The patient is nervous/anxious.     Blood pressure (!) 171/86, pulse 66, temperature 99.1 F (37.3 C), temperature source Oral, resp. rate 18, height 5\' 9"  (1.753 m), weight 77.1 kg, SpO2 99 %.Body mass index is 25.1 kg/m.  General Appearance: Casual  Eye Contact:  Fair  Speech:  Clear and Coherent  Volume:  Normal  Mood:  Anxious  Affect:  Congruent  Thought Process:  Coherent  Orientation:  Full (Time, Place, and Person)  Thought Content:  Rumination  Suicidal Thoughts:  Yes.  with intent/plan  Homicidal Thoughts:  No  Memory:  Recent;   Fair  Judgement:  Impaired  Insight:  Lacking  Psychomotor Activity:  Restlessness  Concentration:  Concentration: Fair  Recall:  of Knowledge:  Fair  Language:  Fair  Akathisia:  No  Handed:  Right  AIMS (if indicated):     Assets:  Communication Skills Desire for Improvement Physical Health Social Support  ADL's:  Intact  Cognition:  WNL  Sleep:        Treatment Plan Summary: 29 year old male with history of bipolar disorder who presents voluntarily with signs and symptoms of depression most notably suicidal ideation.  Patient will require inpatient hospitalization for safety, stabilization, and medication management.  Diagnosis: Bipolar disorder  Disposition: Recommend psychiatric Inpatient admission when medically cleared.  34, MD 07/15/2019  3:53 PM

## 2019-07-16 NOTE — ED Notes (Signed)
Hourly rounding reveals patient sleeping in room. No complaints, stable, in no acute distress. Q15 minute rounds and monitoring via Security Cameras to continue. 

## 2019-07-16 NOTE — ED Provider Notes (Signed)
The patient has been placed in psychiatric observation due to the need to provide a safe environment for the patient while obtaining psychiatric consultation and evaluation, as well as ongoing medical and medication management to treat the patient's condition.  The patient has been placed under full IVC at this time.    Gary Jacobson, [place], MD 07/16/19 [phone]

## 2019-07-16 NOTE — Progress Notes (Signed)
Pt accepted to [facility]  Dr. Forrestine Him is the attending provider.    Call report to [phone]  Nitchia @ [facility] ED notified.     Pt is involuntary and will be transported by law enforcement  Pt may arrive as soon as transportation is arranged and IVC paperwork is faxed to [facility] [phone])  Wells Guiles, LCSW, LCAS Disposition CSW [facility]/TTS [phone]

## 2021-06-09 ENCOUNTER — Emergency Department
Admission: EM | Admit: 2021-06-09 | Discharge: 2021-06-09 | Disposition: A | Discharge status: Home / Self Care | Payer: BC Managed Care – PPO | Attending: Emergency Medicine | Admitting: Emergency Medicine

## 2021-06-09 ENCOUNTER — Emergency Department: Payer: BC Managed Care – PPO

## 2021-06-09 ENCOUNTER — Encounter: Payer: Self-pay | Admitting: Emergency Medicine

## 2021-06-09 ENCOUNTER — Other Ambulatory Visit: Payer: Self-pay

## 2021-06-09 DIAGNOSIS — R1032 Left lower quadrant pain: Secondary | ICD-10-CM | POA: Diagnosis present

## 2021-06-09 DIAGNOSIS — K5900 Constipation, unspecified: Secondary | ICD-10-CM | POA: Diagnosis not present

## 2021-06-09 LAB — COMPREHENSIVE METABOLIC PANEL
ALT: 34 U/L (ref 0–44)
AST: 30 U/L (ref 15–41)
Albumin: 4.8 g/dL (ref 3.5–5.0)
Alkaline Phosphatase: 60 U/L (ref 38–126)
Anion gap: 10 (ref 5–15)
BUN: 20 mg/dL (ref 6–20)
CO2: 23 mmol/L (ref 22–32)
Calcium: 9.6 mg/dL (ref 8.9–10.3)
Chloride: 101 mmol/L (ref 98–111)
Creatinine, Ser: 0.94 mg/dL (ref 0.61–1.24)
GFR, Estimated: >60 mL/min (ref >60)
Glucose, Bld: 102 mg/dL — ABNORMAL HIGH (ref 70–99)
Potassium: 3.9 mmol/L (ref 3.5–5.1)
Sodium: 134 mmol/L — ABNORMAL LOW (ref 135–145)
Total Bilirubin: 0.7 mg/dL (ref 0.3–1.2)
Total Protein: 8.1 g/dL (ref 6.5–8.1)

## 2021-06-09 LAB — CBC WITH DIFFERENTIAL/PLATELET
Abs Immature Granulocytes: 0.01 10*3/uL (ref 0.00–0.07)
Basophils Absolute: 0.1 10*3/uL (ref 0.0–0.1)
Basophils Relative: 1 %
Eosinophils Absolute: 0.1 10*3/uL (ref 0.0–0.5)
Eosinophils Relative: 2 %
HCT: 42.7 % (ref 39.0–52.0)
Hemoglobin: 14.5 g/dL (ref 13.0–17.0)
Immature Granulocytes: 0 %
Lymphocytes Relative: 33 %
Lymphs Abs: 1.9 10*3/uL (ref 0.7–4.0)
MCH: 31 pg (ref 26.0–34.0)
MCHC: 34 g/dL (ref 30.0–36.0)
MCV: 91.2 fL (ref 80.0–100.0)
Monocytes Absolute: 0.6 10*3/uL (ref 0.1–1.0)
Monocytes Relative: 10 %
Neutro Abs: 3 10*3/uL (ref 1.7–7.7)
Neutrophils Relative %: 54 %
Platelets: 216 10*3/uL (ref 150–400)
RBC: 4.68 MIL/uL (ref 4.22–5.81)
RDW: 12.8 % (ref 11.5–15.5)
WBC: 5.7 10*3/uL (ref 4.0–10.5)
nRBC: 0 % (ref 0.0–0.2)

## 2021-06-09 LAB — URINALYSIS, ROUTINE W REFLEX MICROSCOPIC
Bilirubin Urine: NEGATIVE
Glucose, UA: NEGATIVE mg/dL
Hgb urine dipstick: NEGATIVE
Ketones, ur: NEGATIVE mg/dL
Leukocytes,Ua: NEGATIVE
Nitrite: NEGATIVE
Protein, ur: NEGATIVE mg/dL
Specific Gravity, Urine: 1.008 (ref 1.005–1.030)
pH: 6 (ref 5.0–8.0)

## 2021-06-09 LAB — LIPASE, BLOOD: Lipase: 34 U/L (ref 11–51)

## 2021-06-09 MED ORDER — IOHEXOL 300 MG/ML  SOLN
100.0000 mL | Freq: Once | INTRAMUSCULAR | Status: AC | PRN
  Administered 2021-06-09: 100 mL via INTRAVENOUS
  Filled 2021-06-09: qty 100

## 2021-06-09 MED ORDER — DOCUSATE SODIUM 250 MG PO CAPS: 250.0000 mg | ORAL_CAPSULE | Freq: Every day | ORAL | 0 refills | Status: AC

## 2021-06-09 NOTE — ED Triage Notes (Signed)
See paper chart for downtime 

## 2021-06-09 NOTE — Discharge Instructions (Signed)
Please follow-up with your primary care provider for symptoms that are not improving over the next week or so.  Stop taking the medication prescribed today if you develop diarrhea.  Return to the emergency department for symptoms that change or worsen if you are unable to schedule an appointment.

## 2021-06-09 NOTE — ED Provider Notes (Signed)
[facility] Provider Note    Event Date/Time   First MD Initiated Contact with Patient 06/09/21 [phone]     (approximate)   History   Abdominal Pain   HPI  Gary Jacobson is a 31 y.o. male with history of Bipolar 1 and glaucoma and as listed in EMR presents to the emergency department for evaluation of feeling bloated and "full" especially in the left lower quadrant. Last bowel movement was 2 days ago, but was hard and small. Symptoms come and go. No nausea or vomiting. No fever.      Physical Exam   Triage Vital Signs: ED Triage Vitals [06/09/21 0733]  Enc Vitals Group     BP      Pulse      Resp      Temp      Temp src      SpO2      Weight 171 lb 15.3 oz (78 kg)     Height 5\' 9"  (1.753 m)     Head Circumference      Peak Flow      Pain Score      Pain Loc      Pain Edu?      Excl. in GC?     Most recent vital signs: Vitals:   06/09/21 0739  BP: (!) 144/84  Pulse: 70  Resp: 18  Temp: 98 F (36.7 C)  SpO2: 98%    General: Awake, no distress.  CV:  Good peripheral perfusion.  Resp:  Normal effort.  Abd:  No distention. Soft. Bowel sounds active x 4 quadrants. No focal tenderness. No appreciated distension. Other:     ED Results / Procedures / Treatments   Labs (all labs ordered are listed, but only abnormal results are displayed) Labs Reviewed  COMPREHENSIVE METABOLIC PANEL - Abnormal; Notable for the following components:      Result Value   Sodium 134 (*)    Glucose, Bld 102 (*)    All other components within normal limits  URINALYSIS, ROUTINE W REFLEX MICROSCOPIC - Abnormal; Notable for the following components:   Color, Urine STRAW (*)    APPearance CLEAR (*)    All other components within normal limits  CBC WITH DIFFERENTIAL/PLATELET  LIPASE, BLOOD     EKG     RADIOLOGY  Image and radiology report reviewed by me.  Abdomen 1 view: Concern for ileus or early SBO.  CT abdomen and pelvis with contrast:  No bowel obstruction, moderate stool throughout the colon.  PROCEDURES:  Critical Care performed: No  Procedures   MEDICATIONS ORDERED IN ED: Medications  iohexol (OMNIPAQUE) 300 MG/ML solution 100 mL (100 mLs Intravenous Contrast Given 06/09/21 0932)     IMPRESSION / MDM / ASSESSMENT AND PLAN / ED COURSE   I have reviewed the triage note.  Differential diagnosis includes, but is not limited to, constipation, gas, small bowel obstruction.  While awaiting ER room assignment, labs drawn and urine collected. All are reassuring. Exam is reassuring as well. Will get 1 view abdomen to look at bowel gas pattern and amount of retained stool.  Abdominal image somewhat concerning for ileus versus early small bowel obstruction.  Will get a CT of the abdomen and pelvis.  CT of the abdomen and pelvis is reassuring.  He does have moderate stool throughout the colon.  Plan will be to treat him with Colace.  He is to follow-up with his primary care provider if symptoms  or not improving over the week.  He is to return to the emergency department if abdominal pain changes, worsens, or if he has additional concerns and is unable to see primary care.       FINAL CLINICAL IMPRESSION(S) / ED DIAGNOSES   Final diagnoses:  Constipation, unspecified constipation type     Rx / DC Orders   ED Discharge Orders          Ordered    docusate sodium (COLACE) 250 MG capsule  Daily        06/09/21 0955             Note:  This document was prepared using Dragon voice recognition software and may include unintentional dictation errors.   Chinita Pester, FNP 06/09/21 1532    Chesley Noon, MD 06/10/21 [phone]

## 2021-06-09 NOTE — ED Notes (Signed)
Pt was triaged on paper   presented with some abd pain  states pain is mainly on left side  denies any n/v/d or fever

## 2021-09-27 ENCOUNTER — Emergency Department (HOSPITAL_COMMUNITY)
Admission: EM | Admit: 2021-09-27 | Discharge: 2021-09-27 | Disposition: A | Discharge status: Home / Self Care | Payer: BC Managed Care – PPO | Attending: Emergency Medicine | Admitting: Emergency Medicine

## 2021-09-27 ENCOUNTER — Other Ambulatory Visit: Payer: Self-pay

## 2021-09-27 ENCOUNTER — Encounter (HOSPITAL_COMMUNITY): Payer: Self-pay | Admitting: *Deleted

## 2021-09-27 DIAGNOSIS — R1032 Left lower quadrant pain: Secondary | ICD-10-CM | POA: Diagnosis present

## 2021-09-27 DIAGNOSIS — Z79899 Other long term (current) drug therapy: Secondary | ICD-10-CM | POA: Diagnosis not present

## 2021-09-27 DIAGNOSIS — Z8616 Personal history of COVID-19: Secondary | ICD-10-CM | POA: Diagnosis not present

## 2021-09-27 DIAGNOSIS — K5900 Constipation, unspecified: Secondary | ICD-10-CM | POA: Diagnosis not present

## 2021-09-27 HISTORY — DX: Herpesviral infection, unspecified: B00.9

## 2021-09-27 MED ORDER — DICYCLOMINE HCL 20 MG PO TABS: 20.0000 mg | ORAL_TABLET | Freq: Two times a day (BID) | ORAL | 0 refills | Status: AC

## 2021-09-27 NOTE — ED Triage Notes (Signed)
Pt c/o LLQ abdominal pain/bloating since he had Covid in December 2022.

## 2021-09-27 NOTE — Discharge Instructions (Signed)
Take dicyclomine every 6 hours as needed for abdominal pain or constipation, you may also take a stool softener such as Colace or a laxative such as MiraLAX.  Thank you for allowing [place] to treat you in the emergency department today.  After reviewing your examination and potential testing that was done it appears that you are safe to go home.  I would like for you to follow-up with your doctor within the next several days, have them obtain your results and follow-up with them to review all of these tests.  If you should develop severe or worsening symptoms return to the emergency department immediately

## 2021-09-27 NOTE — ED Provider Notes (Signed)
[facility] EMERGENCY DEPARTMENT Provider Note   CSN: [id number] Arrival date & time: 09/27/21  6222     History  Chief Complaint  Patient presents with   Abdominal Pain    Gary Jacobson is a 31 y.o. male.   Abdominal Pain  31 year old male, history of some underlying psychiatric issues for which she does not take medications, he is no longer taking risperidone, fluoxetine or doxepin.  He denies any other chronic medical conditions other than stating that after getting COVID in December 2022 he has developed a left lower quadrant gassy bloated feeling associated with some constipation.  He was initially seen in February 2023 at an outside ER, CT scan was performed at that time and I have obtained those results.  It appears that he had some mild enteritis but also signs of moderate constipation.  There is no other acute surgical findings.  He has felt this daily since that time, he does not have any vomiting, he was able to eat this morning in fact he states he had a leftover hamburger from yesterday before he came to the ER this morning.  He does drink almost every day about 1 beer, he does smoke cigarettes and does smoke marijuana but denies any other drugs of abuse  Home Medications Prior to Admission medications   Medication Sig Start Date End Date Taking? Authorizing Provider  dicyclomine (BENTYL) 20 MG tablet Take 1 tablet (20 mg total) by mouth 2 (two) times daily. 09/27/21  Yes Eber Hong, MD  docusate sodium (COLACE) 250 MG capsule Take 1 capsule (250 mg total) by mouth daily. 06/09/21   Triplett, Cari B, FNP  doxepin (SINEQUAN) 50 MG capsule Take 50 mg by mouth at bedtime. 06/26/19   [provider]  FLUoxetine (PROZAC) 10 MG capsule Take 10 mg by mouth daily. 06/26/19   [provider]  hydrOXYzine (ATARAX/VISTARIL) 25 MG tablet Take 1 tablet (25 mg total) by mouth 3 (three) times daily as needed for itching. 05/19/19   Renford Dills, NP  risperiDONE  (RISPERDAL) 1 MG tablet Take 1 mg by mouth at bedtime. 06/26/19   [provider]      Allergies    Patient has no known allergies.    Review of Systems   Review of Systems  Gastrointestinal:  Positive for abdominal pain.  All other systems reviewed and are negative.  Physical Exam Updated Vital Signs BP (!) 161/98 (BP Location: Left Arm)   Pulse 66   Temp 98 F (36.7 C) (Oral)   Resp 16   Ht 1.753 m (5\' 9" )   Wt 78 kg   SpO2 100%   BMI 25.39 kg/m  Physical Exam Vitals and nursing note reviewed.  Constitutional:      General: He is not in acute distress.    Appearance: He is well-developed.  HENT:     Head: Normocephalic and atraumatic.     Mouth/Throat:     Pharynx: No oropharyngeal exudate.  Eyes:     General: No scleral icterus.       Right eye: No discharge.        Left eye: No discharge.     Conjunctiva/sclera: Conjunctivae normal.     Pupils: Pupils are equal, round, and reactive to light.     Comments: Disconjugate gaze  Neck:     Thyroid: No thyromegaly.     Vascular: No JVD.  Cardiovascular:     Rate and Rhythm: Normal rate and regular rhythm.  Heart sounds: Normal heart sounds. No murmur heard.   No friction rub. No gallop.  Pulmonary:     Effort: Pulmonary effort is normal. No respiratory distress.     Breath sounds: Normal breath sounds. No wheezing or rales.  Abdominal:     General: Bowel sounds are normal. There is no distension.     Palpations: Abdomen is soft. There is no mass.     Tenderness: There is no abdominal tenderness.     Comments: No masses no tenderness no tympanitic sounds to percussion, no CVA tenderness  Musculoskeletal:        General: No tenderness. Normal range of motion.     Cervical back: Normal range of motion and neck supple.  Lymphadenopathy:     Cervical: No cervical adenopathy.  Skin:    General: Skin is warm and dry.     Findings: No erythema or rash.  Neurological:     Mental Status: He is alert.      Coordination: Coordination normal.  Psychiatric:        Behavior: Behavior normal.    ED Results / Procedures / Treatments   Labs (all labs ordered are listed, but only abnormal results are displayed) Labs Reviewed - No data to display  EKG None  Radiology No results found.  Procedures Procedures    Medications Ordered in ED Medications - No data to display  ED Course/ Medical Decision Making/ A&P                           Medical Decision Making Risk Prescription drug management.   5 to 6 months with the symptoms almost every day not having any symptoms at this time, benign abdomen, no need for aggressive interventions or evaluations, will start dicyclomine and have follow-up with family doctor, patient given prescription, understands expectations for return, stable for discharge        Final Clinical Impression(s) / ED Diagnoses Final diagnoses:  Left lower quadrant abdominal pain  Constipation, unspecified constipation type    Rx / DC Orders ED Discharge Orders          Ordered    dicyclomine (BENTYL) 20 MG tablet  2 times daily        09/27/21 0802              Eber Hong, MD 09/27/21 [phone]

## 2022-10-02 IMAGING — CT CT ABD-PELV W/ CM
2 of 4 series · 16 of 46 positions shown, 18 images · IV contrast (APPLIED)
Comparison: Same-day x-ray

CLINICAL DATA: Abdominal pain, constipation

EXAM:
CT ABDOMEN AND PELVIS WITH CONTRAST
TECHNIQUE: Multidetector CT imaging of the abdomen and pelvis was performed
using the standard protocol following bolus administration of
intravenous contrast.

[Series 2: routine abd/pel with · axial · 0.71mm/px · z∈[-559,-114]mm · 13 of 99 slices shown, 15 images]
[im 5/99  soft-tissue]
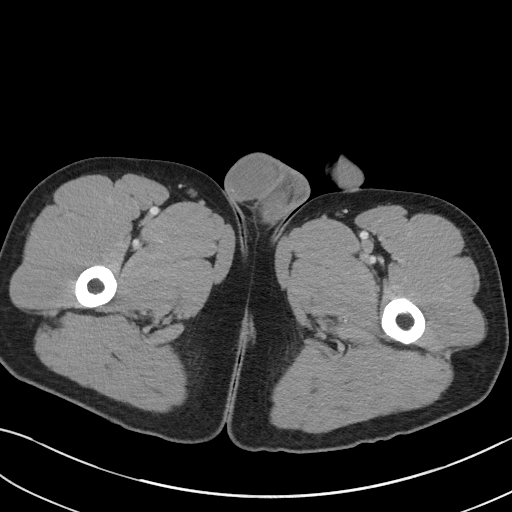
[im 5/99  bone]
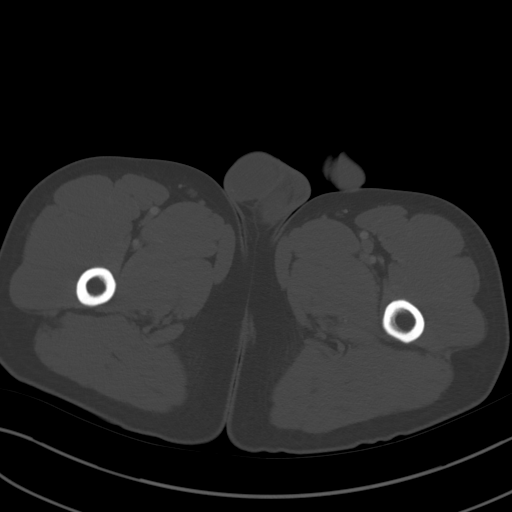
[im 13/99  soft-tissue]
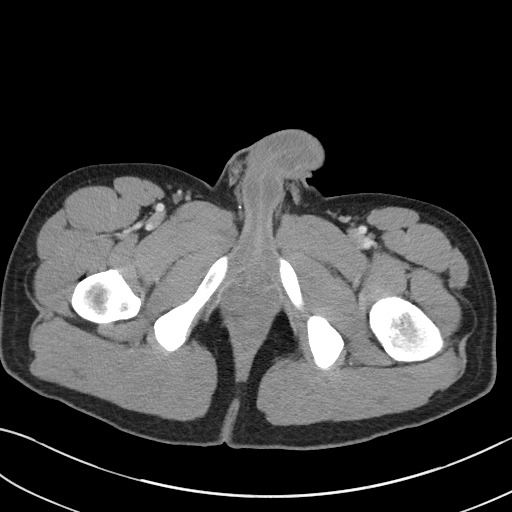
[im 21/99  soft-tissue]
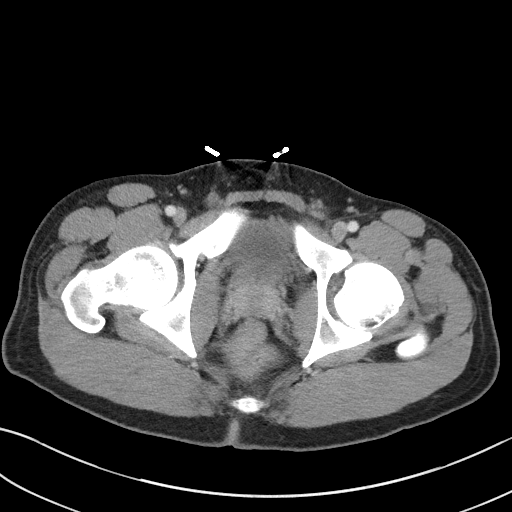
[im 29/99  soft-tissue]
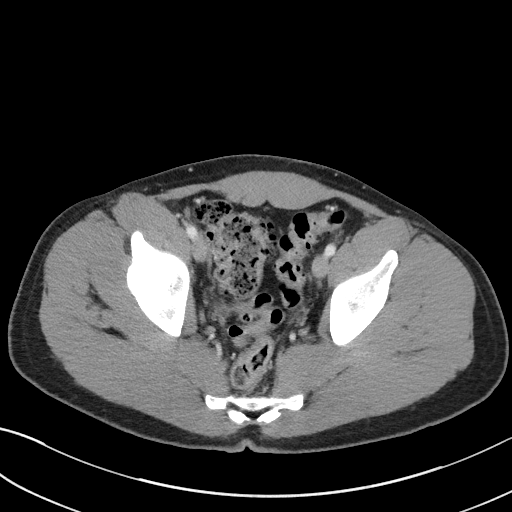
[im 33/99  soft-tissue]
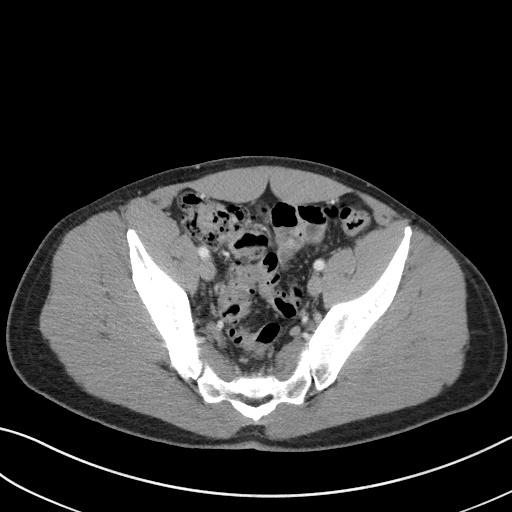
[im 41/99  soft-tissue]
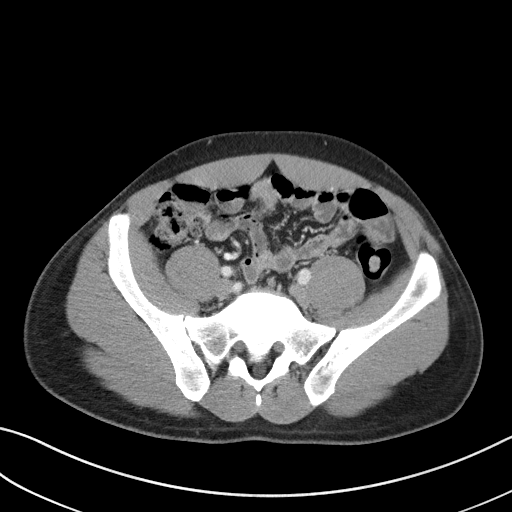
[im 50/99  soft-tissue]
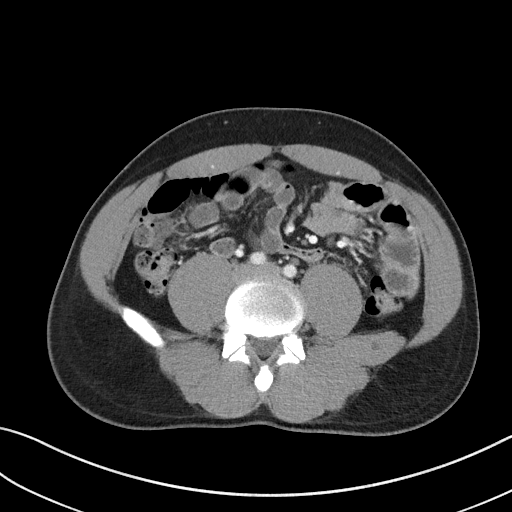
[im 58/99  soft-tissue]
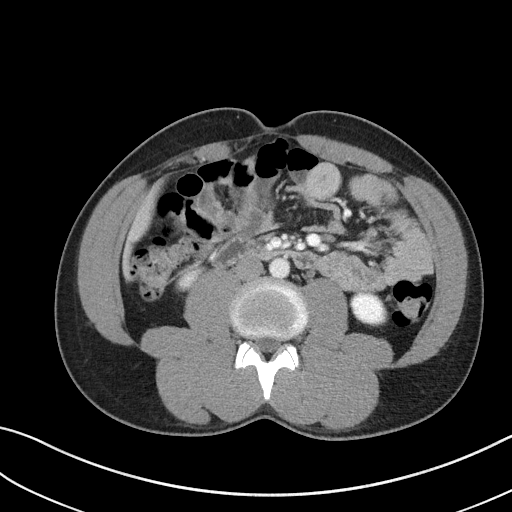
[im 66/99  soft-tissue]
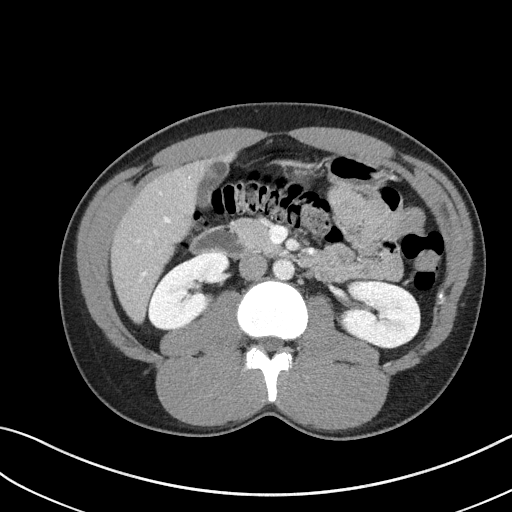
[im 66/99  bone]
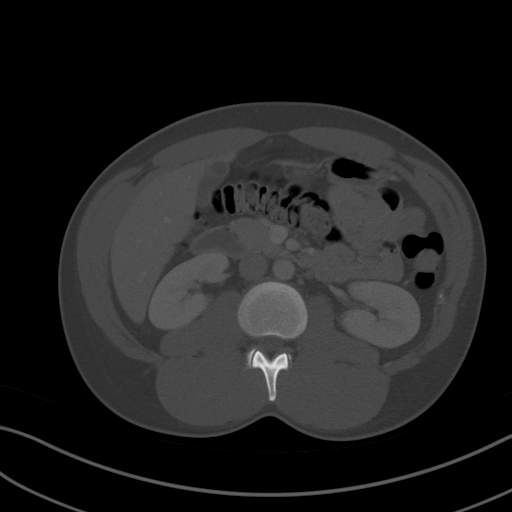
[im 70/99  soft-tissue]
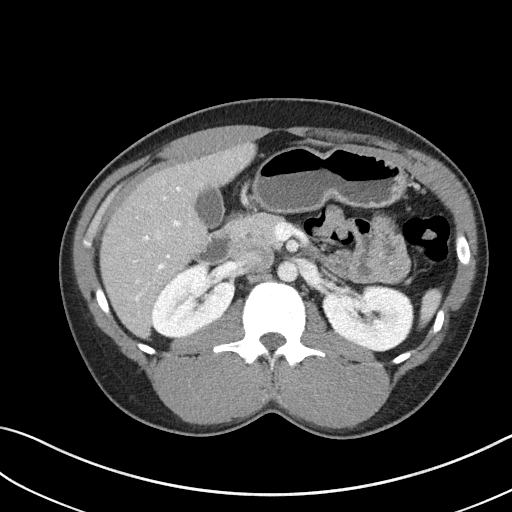
[im 78/99  soft-tissue]
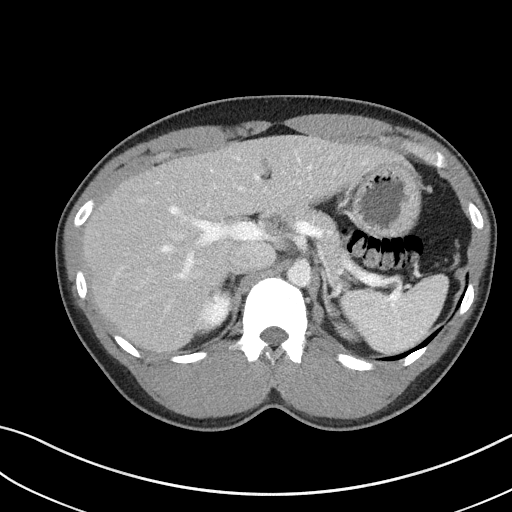
[im 86/99  soft-tissue]
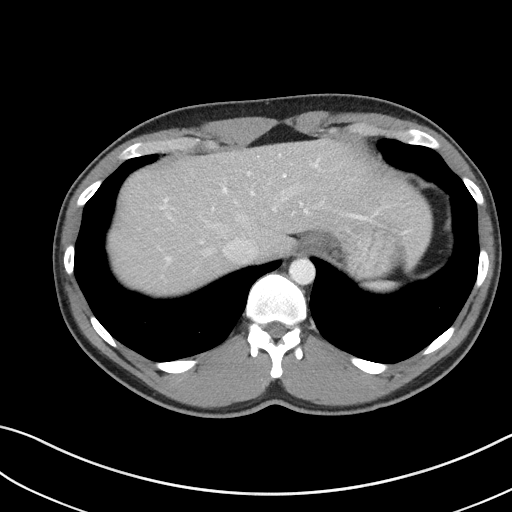
[im 94/99  soft-tissue]
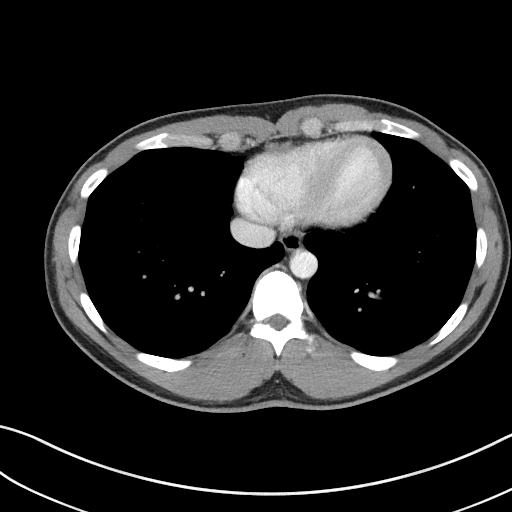

[Series 5: coronal st · coronal · 0.77mm/px · 3 of 88 slices shown]
[im 30/88  soft-tissue]
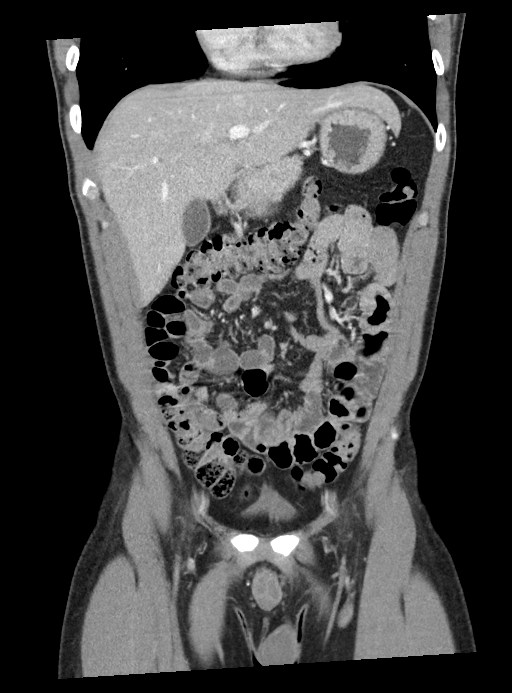
[im 39/88  soft-tissue]
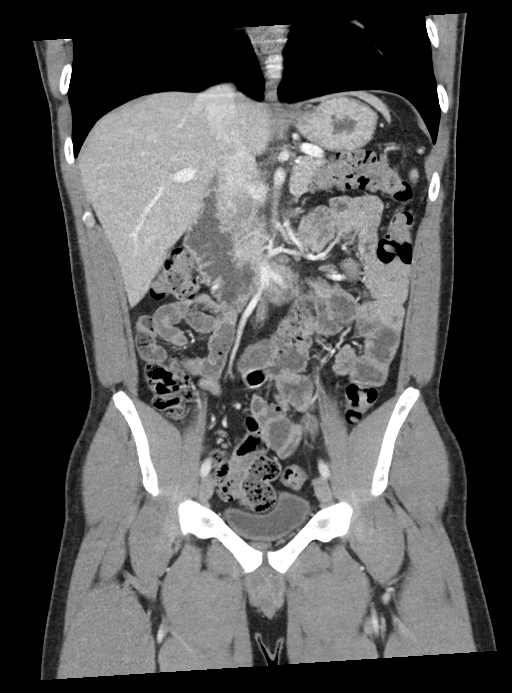
[im 49/88  soft-tissue]
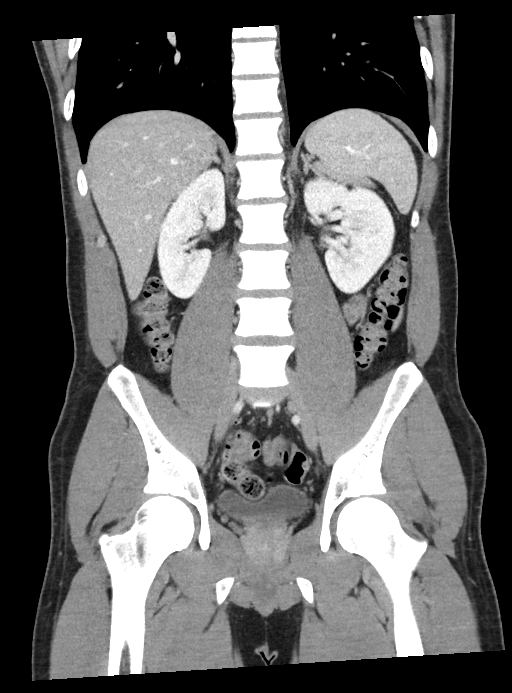

[16 of 46 positions shown; findings below may reference images not displayed]

RADIATION DOSE REDUCTION: This exam was performed according to the
departmental dose-optimization program which includes automated
exposure control, adjustment of the mA and/or kV according to
patient size and/or use of iterative reconstruction technique.

CONTRAST:  100mL OMNIPAQUE IOHEXOL 300 MG/ML  SOLN
FINDINGS: Lower chest: No acute abnormality.

Hepatobiliary: No focal liver abnormality is seen. No gallstones,
gallbladder wall thickening, or biliary dilatation.

Pancreas: Unremarkable. No pancreatic ductal dilatation or
surrounding inflammatory changes.

Spleen: Normal in size without focal abnormality.

Adrenals/Urinary Tract: Unremarkable adrenal glands. Kidneys enhance
symmetrically without focal lesion, stone, or hydronephrosis.
Ureters are nondilated. Urinary bladder appears unremarkable.

Stomach/Bowel: Stomach within normal limits. There are multiple
fluid-filled, nondilated loops of small bowel within the abdomen.
Noninflamed appendix in the right lower quadrant (series 5, image
48). Moderate volume of stool throughout the colon. No focal bowel
wall thickening or inflammatory changes.

Vascular/Lymphatic: No significant vascular findings are present. No
enlarged abdominal or pelvic lymph nodes.

Reproductive: Prostate is unremarkable.

Other: No free fluid. No abdominopelvic fluid collection. No
pneumoperitoneum. No abdominal wall hernia.

Musculoskeletal: No acute or significant osseous findings.
IMPRESSION: 1. Multiple fluid-filled, non-dilated loops of small bowel within
the abdomen, which may reflect a nonspecific enteritis. No evidence
of bowel obstruction.
2. Moderate volume of stool throughout the colon.

## 2022-10-02 IMAGING — CR DG ABDOMEN 1V
1 series · 1 of 1 positions shown · non-contrast
Comparison: None.

CLINICAL DATA: Constipation, bloating, left lower quadrant fullness

EXAM:
ABDOMEN - 1 VIEW

[abdomen kub]
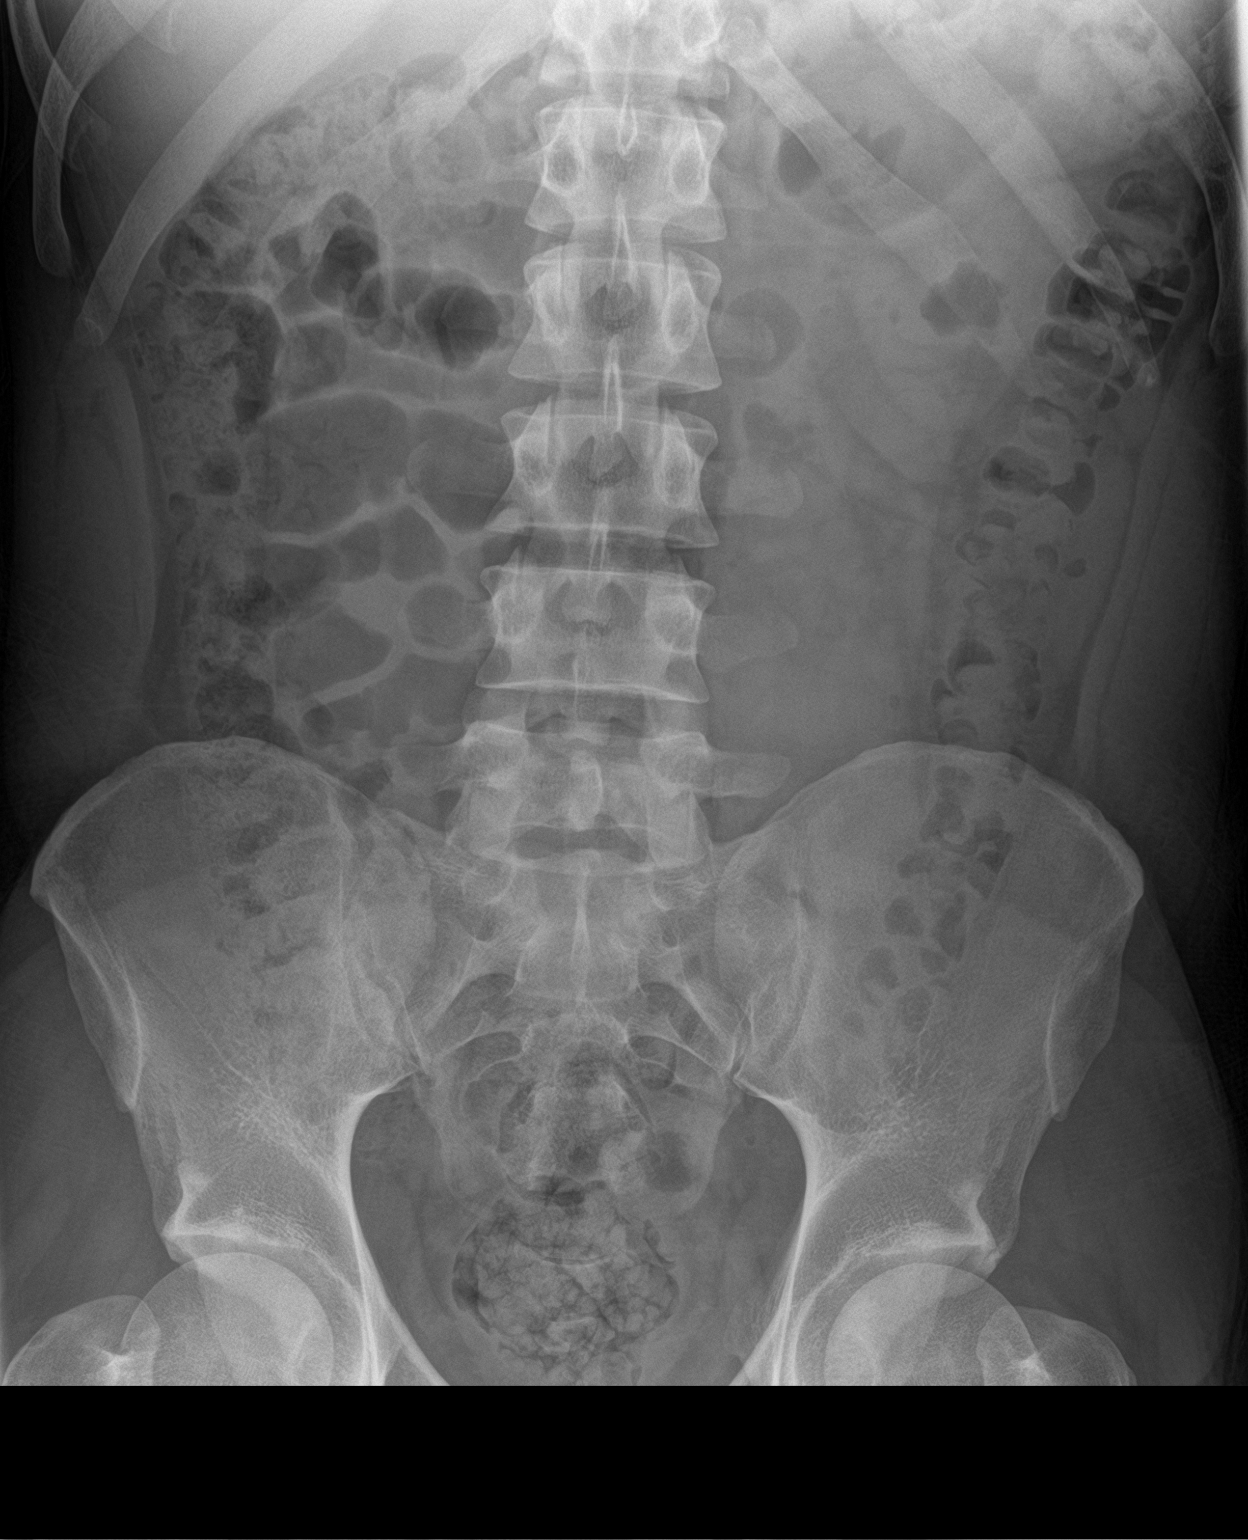

[1 of 1 positions shown; findings below may reference images not displayed]

FINDINGS: A few mildly dilated right abdominal small bowel loops up to 3.4 cm
diameter. Mild-to-moderate diffuse colorectal stool volume. No
evidence of pneumatosis or pneumoperitoneum. No radiopaque
nephrolithiasis.
IMPRESSION: A few mildly dilated right abdominal small bowel loops, nonspecific,
cannot exclude mild ileus or early distal small-bowel obstruction.

Mild-to-moderate diffuse colorectal stool volume.
